# Patient Record
Sex: Female | Born: 1990 | Race: Black or African American | Hispanic: No | Marital: Single | Smoking: Former smoker
Health system: Southern US, Community
[De-identification: ages and names within clinical notes are randomized; demographics above are authoritative.]

## PROBLEM LIST (undated history)

## (undated) ENCOUNTER — Ambulatory Visit: Admission: EM

## (undated) ENCOUNTER — Ambulatory Visit: Source: Home / Self Care

## (undated) DIAGNOSIS — I10 Essential (primary) hypertension: Secondary | ICD-10-CM

## (undated) DIAGNOSIS — S66809A Unspecified injury of other specified muscles, fascia and tendons at wrist and hand level, unspecified hand, initial encounter: Secondary | ICD-10-CM

## (undated) DIAGNOSIS — H9190 Unspecified hearing loss, unspecified ear: Secondary | ICD-10-CM

## (undated) DIAGNOSIS — G932 Benign intracranial hypertension: Secondary | ICD-10-CM

## (undated) DIAGNOSIS — G4700 Insomnia, unspecified: Secondary | ICD-10-CM

## (undated) DIAGNOSIS — G43909 Migraine, unspecified, not intractable, without status migrainosus: Secondary | ICD-10-CM

## (undated) DIAGNOSIS — F419 Anxiety disorder, unspecified: Secondary | ICD-10-CM

## (undated) DIAGNOSIS — R519 Headache, unspecified: Secondary | ICD-10-CM

## (undated) DIAGNOSIS — M1990 Unspecified osteoarthritis, unspecified site: Secondary | ICD-10-CM

## (undated) DIAGNOSIS — Z9889 Other specified postprocedural states: Secondary | ICD-10-CM

## (undated) DIAGNOSIS — R112 Nausea with vomiting, unspecified: Secondary | ICD-10-CM

## (undated) HISTORY — PX: KNEE SURGERY: SHX244

## (undated) HISTORY — PX: BUNIONECTOMY: SHX129

## (undated) HISTORY — PX: HAND SURGERY: SHX662

## (undated) HISTORY — DX: Headache, unspecified: R51.9

## (undated) HISTORY — DX: Migraine, unspecified, not intractable, without status migrainosus: G43.909

## (undated) HISTORY — DX: Insomnia, unspecified: G47.00

## (undated) HISTORY — DX: Essential (primary) hypertension: I10

## (undated) HISTORY — DX: Anxiety disorder, unspecified: F41.9

## (undated) HISTORY — DX: Benign intracranial hypertension: G93.2

## (undated) HISTORY — DX: Unspecified osteoarthritis, unspecified site: M19.90

---
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
## 2008-07-04 ENCOUNTER — Emergency Department (HOSPITAL_COMMUNITY): Admission: EM | Admit: 2008-07-04 | Discharge: 2008-07-04 | Payer: Self-pay | Admitting: Family Medicine

## 2008-08-06 ENCOUNTER — Emergency Department (HOSPITAL_COMMUNITY): Admission: EM | Admit: 2008-08-06 | Discharge: 2008-08-06 | Payer: Self-pay | Admitting: Emergency Medicine

## 2010-01-04 ENCOUNTER — Encounter: Admission: RE | Admit: 2010-01-04 | Discharge: 2010-01-04 | Payer: Self-pay | Admitting: Internal Medicine

## 2010-12-08 ENCOUNTER — Inpatient Hospital Stay (HOSPITAL_COMMUNITY)
Admission: AD | Admit: 2010-12-08 | Discharge: 2010-12-08 | Disposition: A | Discharge status: Home / Self Care | Payer: Medicare Other | Source: Ambulatory Visit | Attending: Obstetrics & Gynecology | Admitting: Obstetrics & Gynecology

## 2010-12-08 DIAGNOSIS — O99891 Other specified diseases and conditions complicating pregnancy: Secondary | ICD-10-CM | POA: Insufficient documentation

## 2010-12-08 DIAGNOSIS — O9989 Other specified diseases and conditions complicating pregnancy, childbirth and the puerperium: Secondary | ICD-10-CM

## 2010-12-08 DIAGNOSIS — J069 Acute upper respiratory infection, unspecified: Secondary | ICD-10-CM

## 2011-03-26 HISTORY — PX: WISDOM TOOTH EXTRACTION: SHX21

## 2011-09-17 ENCOUNTER — Other Ambulatory Visit: Payer: Self-pay | Admitting: Gastroenterology

## 2011-09-17 DIAGNOSIS — R112 Nausea with vomiting, unspecified: Secondary | ICD-10-CM

## 2011-09-17 DIAGNOSIS — R1013 Epigastric pain: Secondary | ICD-10-CM

## 2011-10-02 ENCOUNTER — Ambulatory Visit (HOSPITAL_COMMUNITY)
Admission: RE | Admit: 2011-10-02 | Discharge: 2011-10-02 | Disposition: A | Discharge status: Home / Self Care | Payer: Medicare Other | Source: Ambulatory Visit | Attending: Gastroenterology | Admitting: Gastroenterology

## 2011-10-02 DIAGNOSIS — R1013 Epigastric pain: Secondary | ICD-10-CM

## 2011-10-02 DIAGNOSIS — R112 Nausea with vomiting, unspecified: Secondary | ICD-10-CM

## 2011-10-02 DIAGNOSIS — R109 Unspecified abdominal pain: Secondary | ICD-10-CM | POA: Insufficient documentation

## 2011-10-02 MED ORDER — TECHNETIUM TC 99M MEBROFENIN IV KIT
5.1000 | PACK | Freq: Once | INTRAVENOUS | Status: AC | PRN
  Administered 2011-10-02: 5.1 via INTRAVENOUS

## 2011-10-24 ENCOUNTER — Ambulatory Visit (INDEPENDENT_AMBULATORY_CARE_PROVIDER_SITE_OTHER): Payer: Medicare Other | Admitting: General Surgery

## 2011-10-28 ENCOUNTER — Encounter (INDEPENDENT_AMBULATORY_CARE_PROVIDER_SITE_OTHER): Payer: Self-pay | Admitting: General Surgery

## 2011-10-30 ENCOUNTER — Encounter (INDEPENDENT_AMBULATORY_CARE_PROVIDER_SITE_OTHER): Payer: Self-pay | Admitting: General Surgery

## 2011-10-30 ENCOUNTER — Ambulatory Visit (INDEPENDENT_AMBULATORY_CARE_PROVIDER_SITE_OTHER): Payer: Medicare Other | Admitting: General Surgery

## 2011-10-30 VITALS — BP 118/76 | HR 70 | Temp 97.4°F | Resp 16 | Ht 71.0 in | Wt 196.8 lb

## 2011-10-30 DIAGNOSIS — K828 Other specified diseases of gallbladder: Secondary | ICD-10-CM

## 2011-10-30 NOTE — Progress Notes (Signed)
Patient ID: Patricia Sanchez, female   DOB: April 11, 1991, 21 y.o.   MRN: [id number]  Chief Complaint  Patient presents with  . Other    Eval low ejection fraction  This history and exam were conducted with the aid of a sign language interpreter  HPI Patricia Sanchez is a 21 y.o. female.   HPI 21 year old [place] American female referred by Dr. Jeani Hawking for evaluation of her gallbladder. The patient thinks that she's been having problems for about 3 years however it acutely worsened this past June. She states that she will wake up in the morning with nausea. She will also developed postprandial epigastric discomfort associated with nausea and vomiting. She states that she will develop severe nausea and vomiting about 15-20 minutes after eating. This will be accompanied by discomfort or tightness in her upper abdomen. It does not radiate. She denies any NSAID use. She has tried reflux medication without any relief. She also occasionally has loose stool. She states that spicy foods will make his symptoms worse. She states that she's also 100 pounds over the past year and a half. She denies any fevers or chills. She denies any dysuria. She denies any melena or hematochezia. She denies any jaundice. She denies any acholic stools. She denies any trouble swallowing. She has had an upper and lower endoscopy. She has also had a HIDA scan. Past Medical History  Diagnosis Date  . Hearing impaired person   . Diarrhea   . Nausea & vomiting     Past Surgical History  Procedure Date  . Tonsillectomy 2001    Family History  Problem Relation Age of Onset  . Hypertension Mother   . Other Father     murder    Social History History  Substance Use Topics  . Smoking status: Current Everyday Smoker -- 2 years    Types: Cigarettes  . Smokeless tobacco: Never Used  . Alcohol Use: Yes     socially    No Known Allergies  No current outpatient prescriptions on file.    Review of  Systems Review of Systems  Constitutional: Positive for unexpected weight change. Negative for fever and chills.  HENT: Positive for hearing loss. Negative for congestion, sore throat, trouble swallowing and voice change.   Eyes: Negative for visual disturbance.  Respiratory: Negative for cough and wheezing.   Cardiovascular: Negative for chest pain, palpitations and leg swelling.  Gastrointestinal: Negative for diarrhea, constipation, blood in stool and anal bleeding.  Genitourinary: Negative for dysuria, hematuria, vaginal bleeding and difficulty urinating.  Musculoskeletal: Negative for arthralgias.  Skin: Negative for rash and wound.  Neurological: Negative for seizures, syncope and headaches.  Hematological: Negative for adenopathy. Does not bruise/bleed easily.  Psychiatric/Behavioral: Negative for confusion.    Blood pressure 118/76, pulse 70, temperature 97.4 F (36.3 C), temperature source Temporal, resp. rate 16, height 5\' 11"  (1.803 m), weight 196 lb 12.8 oz (89.268 kg).  Physical Exam Physical Exam  Vitals reviewed. Constitutional: She is oriented to person, place, and time. She appears well-developed and well-nourished. No distress.  HENT:  Head: Normocephalic and atraumatic.  Mouth/Throat: No oropharyngeal exudate.  Eyes: Conjunctivae are normal. No scleral icterus.  Neck: Neck supple. No tracheal deviation present. No thyromegaly present.  Cardiovascular: Normal rate, regular rhythm and normal heart sounds.   Pulmonary/Chest: Effort normal and breath sounds normal. No respiratory distress. She has no wheezes.  Abdominal: Soft. Bowel sounds are normal. She exhibits no distension. There is no tenderness. There is no rebound.  Musculoskeletal: Normal range of motion. She exhibits no edema and no tenderness.  Lymphadenopathy:    She has no cervical adenopathy.  Neurological: She is alert and oriented to person, place, and time. She exhibits normal muscle tone.  Skin:  Skin is warm and dry. She is not diaphoretic.       Scattered tattoos. Belly-button ring  Psychiatric: She has a normal mood and affect. Her behavior is normal. Judgment and thought content normal.    Data Reviewed EGD and colonoscopy report Dr Haywood Pao note HIDA scan  Assessment    Biliary dyskinesia    Plan    We discussed gallbladder disease. The patient was given Agricultural engineer. We discussed non-operative and operative management. Her symptoms and HIDA are suggestive of Biliary dyskinesia.  She has had a negative EGD which I think  also supports biliary dyskinesia as the probable etiology of her symptoms. The only symptom that is somewhat atypical is the excessive weight loss.   I discussed laparoscopic cholecystectomy possible cholangiogram in detail.  The patient was given educational material as well as diagrams detailing the procedure.  We discussed the risks and benefits of a laparoscopic cholecystectomy including, but not limited to bleeding, infection, injury to surrounding structures such as the intestine or liver, bile leak, retained gallstones, need to convert to an open procedure, prolonged diarrhea, blood clots such as  DVT, common bile duct injury, anesthesia risks, and possible need for additional procedures.  We discussed the typical post-operative recovery course. I explained that the likelihood of improvement of their symptoms is good. We also discussed the possibility of her symptoms not resolving which I think are low.   Mary Sella. Andrey Campanile, MD, FACS General, Bariatric, & Minimally Invasive Surgery [facility], [place]         [facility] M 10/30/2011, 10:57 AM

## 2011-10-30 NOTE — Patient Instructions (Signed)
Biliary Colic  Biliary colic is a steady or irregular pain in the upper abdomen. It is usually under the right side of the rib cage. It happens when gallstones interfere with the normal flow of bile from the gallbladder. Bile is a liquid that helps to digest fats. Bile is made in the liver and stored in the gallbladder. When you eat a meal, bile passes from the gallbladder through the cystic duct and the common bile duct into the small intestine. There, it mixes with partially digested food. If a gallstone blocks either of these ducts, the normal flow of bile is blocked. The muscle cells in the bile duct contract forcefully to try to move the stone. This causes the pain of biliary colic.  SYMPTOMS   A person with biliary colic usually complains of pain in the upper abdomen. This pain can be:   In the center of the upper abdomen just below the breastbone.   In the upper-right part of the abdomen, near the gallbladder and liver.   Spread back toward the right shoulder blade.   Nausea and vomiting.   The pain usually occurs after eating.   Biliary colic is usually triggered by the digestive system's demand for bile. The demand for bile is high after fatty meals. Symptoms can also occur when a person who has been fasting suddenly eats a very large meal. Most episodes of biliary colic pass after 1 to 5 hours. After the most intense pain passes, your abdomen may continue to ache mildly for about 24 hours.  DIAGNOSIS  After you describe your symptoms, your caregiver will perform a physical exam. He or she will pay attention to the upper right portion of your belly (abdomen). This is the area of your liver and gallbladder. An ultrasound will help your caregiver look for gallstones. Specialized scans of the gallbladder may also be done. Blood tests may be done, especially if you have fever or if your pain persists. PREVENTION  Biliary colic can be prevented by controlling the risk factors for gallstones.  Some of these risk factors, such as heredity, increasing age, and pregnancy are a normal part of life. Obesity and a high-fat diet are risk factors you can change through a healthy lifestyle. Women going through menopause who take hormone replacement therapy (estrogen) are also more likely to develop biliary colic. TREATMENT   Pain medication may be prescribed.   You may be encouraged to eat a fat-free diet.   If the first episode of biliary colic is severe, or episodes of colic keep retuning, surgery to remove the gallbladder (cholecystectomy) is usually recommended. This procedure can be done through small incisions using an instrument called a laparoscope. The procedure often requires a brief stay in the hospital. Some people can leave the hospital the same day. It is the most widely used treatment in people troubled by painful gallstones. It is effective and safe, with no complications in more than 90% of cases.   If surgery cannot be done, medication that dissolves gallstones may be used. This medication is expensive and can take months or years to work. Only small stones will dissolve.   Rarely, medication to dissolve gallstones is combined with a procedure called shock-wave lithotripsy. This procedure uses carefully aimed shock waves to break up gallstones. In many people treated with this procedure, gallstones form again within a few years.  PROGNOSIS  If gallstones block your cystic duct or common bile duct, you are at risk for repeated episodes of   biliary colic. There is also a 25% chance that you will develop a gallbladder infection(acute cholecystitis), or some other complication of gallstones within 10 to 20 years. If you have surgery, schedule it at a time that is convenient for you and at a time when you are not sick. HOME CARE INSTRUCTIONS   Drink plenty of clear fluids.   Avoid fatty, greasy or fried foods, or any foods that make your pain worse.   Take medications as directed.    SEEK MEDICAL CARE IF:   You develop a fever over 100.5 F (38.1 C).   Your pain gets worse over time.   You develop nausea that prevents you from eating and drinking.   You develop vomiting.  SEEK IMMEDIATE MEDICAL CARE IF:   You have continuous or severe belly (abdominal) pain which is not relieved with medications.   You develop nausea and vomiting which is not relieved with medications.   You have symptoms of biliary colic and you suddenly develop a fever and shaking chills. This may signal cholecystitis. Call your caregiver immediately.   You develop a yellow color to your skin or the white part of your eyes (jaundice).  Document Released: 03/10/2006 Document Revised: 06/19/2011 Document Reviewed: 05/19/2008 Exit[employer] Patient Information 2012 [employer]. 

## 2011-10-31 ENCOUNTER — Encounter (HOSPITAL_COMMUNITY): Payer: Self-pay

## 2011-10-31 ENCOUNTER — Encounter (HOSPITAL_COMMUNITY)
Admission: RE | Admit: 2011-10-31 | Discharge: 2011-10-31 | Disposition: A | Discharge status: Home / Self Care | Payer: Federal, State, Local not specified - PPO | Source: Ambulatory Visit | Attending: General Surgery | Admitting: General Surgery

## 2011-10-31 LAB — DIFFERENTIAL
Basophils Absolute: 0 10*3/uL (ref 0.0–0.1)
Basophils Relative: 1 % (ref 0–1)
Lymphocytes Relative: 43 % (ref 12–46)
Neutro Abs: 2.3 10*3/uL (ref 1.7–7.7)

## 2011-10-31 LAB — COMPREHENSIVE METABOLIC PANEL
ALT: 14 U/L (ref 0–35)
CO2: 26 mEq/L (ref 19–32)
Calcium: 9.4 mg/dL (ref 8.4–10.5)
Chloride: 103 mEq/L (ref 96–112)
Creatinine, Ser: 1.12 mg/dL — ABNORMAL HIGH (ref 0.50–1.10)
GFR calc Af Amer: 80 mL/min — ABNORMAL LOW (ref >90)
GFR calc non Af Amer: 69 mL/min — ABNORMAL LOW (ref >90)
Glucose, Bld: 88 mg/dL (ref 70–99)
Potassium: 4.3 mEq/L (ref 3.5–5.1)
Sodium: 138 mEq/L (ref 135–145)
Total Protein: 7.3 g/dL (ref 6.0–8.3)

## 2011-10-31 LAB — CBC
HCT: 39.2 % (ref 36.0–46.0)
Hemoglobin: 13 g/dL (ref 12.0–15.0)
MCH: 29.7 pg (ref 26.0–34.0)
MCHC: 33.2 g/dL (ref 30.0–36.0)

## 2011-10-31 NOTE — Patient Instructions (Signed)
[address]raft  10/31/2011   Your procedure is scheduled on:  11/01/11  Surgery  [id number]    Friday  Report to [facility] at 0615 AM.  Call this number if you have problems the morning of surgery: [phone]   Remember:   Do not eat food:After Midnight.tonight  May have clear liquids:until Midnight .tonight  Clear liquids include soda, tea, black coffee, apple or grape juice, broth.  Take these medicines the morning of surgery with A SIP OF WATER: none   Do not wear jewelry, make-up or nail polish.  Do not wear lotions, powders, or perfumes. You may wear deodorant.  Do not shave 48 hours prior to surgery.  Do not bring valuables to the hospital.  Contacts, dentures or bridgework may not be worn into surgery.  Leave suitcase in the car. After surgery it may be brought to your room.  For patients admitted to the hospital, checkout time is 11:00 AM the day of discharge.   Patients discharged the day of surgery will not be allowed to drive home.  Name and phone number of your driver: mom Special Instructions: CHG Shower Use Special Wash: 1/2 bottle night before surgery and 1/2 bottle morning of surgery. Regular soap face and privates   Please read over the following fact sheets that you were given: MRSA Information

## 2011-10-31 NOTE — Pre-Procedure Instructions (Signed)
Interpretor Lyda Perone present for interview and labs

## 2011-11-01 ENCOUNTER — Ambulatory Visit (HOSPITAL_COMMUNITY): Payer: Federal, State, Local not specified - PPO

## 2011-11-01 ENCOUNTER — Encounter (HOSPITAL_COMMUNITY): Payer: Self-pay | Admitting: Certified Registered Nurse Anesthetist

## 2011-11-01 ENCOUNTER — Encounter (HOSPITAL_COMMUNITY): Payer: Self-pay | Admitting: *Deleted

## 2011-11-01 ENCOUNTER — Ambulatory Visit (HOSPITAL_COMMUNITY): Payer: Federal, State, Local not specified - PPO | Admitting: Certified Registered Nurse Anesthetist

## 2011-11-01 ENCOUNTER — Encounter (HOSPITAL_COMMUNITY)
Admission: AD | Disposition: A | Discharge status: Home / Self Care | Payer: Self-pay | Source: Ambulatory Visit | Attending: General Surgery

## 2011-11-01 ENCOUNTER — Encounter (INDEPENDENT_AMBULATORY_CARE_PROVIDER_SITE_OTHER): Payer: Self-pay | Admitting: Gastroenterology

## 2011-11-01 ENCOUNTER — Ambulatory Visit (HOSPITAL_COMMUNITY)
Admission: AD | Admit: 2011-11-01 | Discharge: 2011-11-02 | Disposition: A | Discharge status: Home / Self Care | Payer: Federal, State, Local not specified - PPO | Source: Ambulatory Visit | Attending: General Surgery | Admitting: General Surgery

## 2011-11-01 ENCOUNTER — Other Ambulatory Visit (INDEPENDENT_AMBULATORY_CARE_PROVIDER_SITE_OTHER): Payer: Self-pay | Admitting: General Surgery

## 2011-11-01 DIAGNOSIS — K811 Chronic cholecystitis: Secondary | ICD-10-CM | POA: Insufficient documentation

## 2011-11-01 DIAGNOSIS — Z01812 Encounter for preprocedural laboratory examination: Secondary | ICD-10-CM | POA: Insufficient documentation

## 2011-11-01 DIAGNOSIS — R197 Diarrhea, unspecified: Secondary | ICD-10-CM | POA: Insufficient documentation

## 2011-11-01 DIAGNOSIS — R110 Nausea: Secondary | ICD-10-CM | POA: Insufficient documentation

## 2011-11-01 DIAGNOSIS — R1013 Epigastric pain: Secondary | ICD-10-CM | POA: Insufficient documentation

## 2011-11-01 DIAGNOSIS — R112 Nausea with vomiting, unspecified: Secondary | ICD-10-CM | POA: Insufficient documentation

## 2011-11-01 DIAGNOSIS — K828 Other specified diseases of gallbladder: Secondary | ICD-10-CM | POA: Insufficient documentation

## 2011-11-01 DIAGNOSIS — H9190 Unspecified hearing loss, unspecified ear: Secondary | ICD-10-CM | POA: Insufficient documentation

## 2011-11-01 DIAGNOSIS — R1011 Right upper quadrant pain: Secondary | ICD-10-CM | POA: Insufficient documentation

## 2011-11-01 HISTORY — PX: CHOLECYSTECTOMY: SHX55

## 2011-11-01 LAB — ABO/RH: ABO/RH(D): B POS

## 2011-11-01 LAB — CBC
MCH: 29.5 pg (ref 26.0–34.0)
MCHC: 33.1 g/dL (ref 30.0–36.0)
MCV: 89.2 fL (ref 78.0–100.0)
Platelets: 193 10*3/uL (ref 150–400)
RBC: 4.27 MIL/uL (ref 3.87–5.11)
RDW: 13.9 % (ref 11.5–15.5)

## 2011-11-01 SURGERY — LAPAROSCOPIC CHOLECYSTECTOMY
Anesthesia: General | Site: Abdomen | Wound class: Clean Contaminated

## 2011-11-01 MED ORDER — LACTATED RINGERS IR SOLN
Status: DC | PRN
  Administered 2011-11-01: 1000 mL

## 2011-11-01 MED ORDER — MORPHINE SULFATE 2 MG/ML IJ SOLN
2.0000 mg | INTRAMUSCULAR | Status: DC | PRN
  Administered 2011-11-01 (×2): 4 mg via INTRAVENOUS
  Administered 2011-11-01 – 2011-11-02 (×3): 2 mg via INTRAVENOUS
  Filled 2011-11-01 (×7): qty 1

## 2011-11-01 MED ORDER — PROMETHAZINE HCL 25 MG/ML IJ SOLN
6.2500 mg | INTRAMUSCULAR | Status: DC | PRN
Start: 2011-11-01 — End: 2011-11-01
  Administered 2011-11-01: 6.25 mg via INTRAVENOUS

## 2011-11-01 MED ORDER — FENTANYL CITRATE 0.05 MG/ML IJ SOLN
INTRAMUSCULAR | Status: DC | PRN
  Administered 2011-11-01 (×6): 50 ug via INTRAVENOUS

## 2011-11-01 MED ORDER — CISATRACURIUM BESYLATE 2 MG/ML IV SOLN
INTRAVENOUS | Status: DC | PRN
  Administered 2011-11-01: 6 mg via INTRAVENOUS

## 2011-11-01 MED ORDER — ONDANSETRON HCL 4 MG/2ML IJ SOLN
INTRAMUSCULAR | Status: DC | PRN
  Administered 2011-11-01: 4 mg via INTRAVENOUS

## 2011-11-01 MED ORDER — PANTOPRAZOLE SODIUM 40 MG IV SOLR
40.0000 mg | INTRAVENOUS | Status: DC
  Administered 2011-11-01: 40 mg via INTRAVENOUS
  Filled 2011-11-01 (×2): qty 40

## 2011-11-01 MED ORDER — DOCUSATE SODIUM 100 MG PO CAPS
100.0000 mg | ORAL_CAPSULE | Freq: Two times a day (BID) | ORAL | Status: DC
  Administered 2011-11-01: 100 mg via ORAL
  Filled 2011-11-01 (×3): qty 1

## 2011-11-01 MED ORDER — LACTATED RINGERS IV SOLN
INTRAVENOUS | Status: DC | PRN
  Administered 2011-11-01 (×2): via INTRAVENOUS

## 2011-11-01 MED ORDER — LIDOCAINE HCL (CARDIAC) 20 MG/ML IV SOLN
INTRAVENOUS | Status: DC | PRN
  Administered 2011-11-01: 80 mg via INTRAVENOUS

## 2011-11-01 MED ORDER — CEFOXITIN SODIUM-DEXTROSE 1-4 GM-% IV SOLR (PREMIX)
INTRAVENOUS | Status: AC
  Filled 2011-11-01: qty 100

## 2011-11-01 MED ORDER — FENTANYL CITRATE 0.05 MG/ML IJ SOLN
INTRAMUSCULAR | Status: AC
  Filled 2011-11-01: qty 2

## 2011-11-01 MED ORDER — BUPIVACAINE-EPINEPHRINE 0.25% -1:200000 IJ SOLN
INTRAMUSCULAR | Status: DC | PRN
  Administered 2011-11-01: 30 mL

## 2011-11-01 MED ORDER — LACTATED RINGERS IV SOLN: INTRAVENOUS | Status: DC

## 2011-11-01 MED ORDER — ACETAMINOPHEN 10 MG/ML IV SOLN
INTRAVENOUS | Status: DC | PRN
  Administered 2011-11-01: 1000 mg via INTRAVENOUS

## 2011-11-01 MED ORDER — DEXTROSE 5 % IV SOLN
2.0000 g | INTRAVENOUS | Status: AC
  Administered 2011-11-01: 2 g via INTRAVENOUS

## 2011-11-01 MED ORDER — SUCCINYLCHOLINE CHLORIDE 20 MG/ML IJ SOLN
INTRAMUSCULAR | Status: DC | PRN
  Administered 2011-11-01: 100 mg via INTRAVENOUS

## 2011-11-01 MED ORDER — IOHEXOL 300 MG/ML  SOLN
INTRAMUSCULAR | Status: DC | PRN
  Administered 2011-11-01: 25 mL

## 2011-11-01 MED ORDER — ACETAMINOPHEN 10 MG/ML IV SOLN
INTRAVENOUS | Status: AC
  Filled 2011-11-01: qty 100

## 2011-11-01 MED ORDER — ONDANSETRON HCL 4 MG/2ML IJ SOLN: 4.0000 mg | Freq: Four times a day (QID) | INTRAMUSCULAR | Status: DC | PRN

## 2011-11-01 MED ORDER — KCL IN DEXTROSE-NACL 20-5-0.45 MEQ/L-%-% IV SOLN
INTRAVENOUS | Status: DC
  Administered 2011-11-01: 75 mL/h via INTRAVENOUS
  Administered 2011-11-02: 05:00:00 via INTRAVENOUS
  Filled 2011-11-01 (×4): qty 1000

## 2011-11-01 MED ORDER — FENTANYL CITRATE 0.05 MG/ML IJ SOLN
25.0000 ug | INTRAMUSCULAR | Status: DC | PRN
  Administered 2011-11-01 (×3): 50 ug via INTRAVENOUS

## 2011-11-01 MED ORDER — MIDAZOLAM HCL 5 MG/5ML IJ SOLN
INTRAMUSCULAR | Status: DC | PRN
  Administered 2011-11-01: 1 mg via INTRAVENOUS

## 2011-11-01 MED ORDER — OXYCODONE HCL 5 MG PO TABS
5.0000 mg | ORAL_TABLET | ORAL | Status: DC | PRN
  Administered 2011-11-01 – 2011-11-02 (×4): 10 mg via ORAL
  Filled 2011-11-01 (×4): qty 2

## 2011-11-01 MED ORDER — PROPOFOL 10 MG/ML IV BOLUS
INTRAVENOUS | Status: DC | PRN
  Administered 2011-11-01: 200 mg via INTRAVENOUS

## 2011-11-01 MED ORDER — PROMETHAZINE HCL 25 MG/ML IJ SOLN
INTRAMUSCULAR | Status: AC
  Filled 2011-11-01: qty 1

## 2011-11-01 MED ORDER — ACETAMINOPHEN 10 MG/ML IV SOLN
1000.0000 mg | Freq: Four times a day (QID) | INTRAVENOUS | Status: DC
  Administered 2011-11-01 – 2011-11-02 (×3): 1000 mg via INTRAVENOUS
  Filled 2011-11-01 (×4): qty 100

## 2011-11-01 MED ORDER — ONDANSETRON HCL 4 MG PO TABS: 4.0000 mg | ORAL_TABLET | Freq: Four times a day (QID) | ORAL | Status: DC | PRN

## 2011-11-01 SURGICAL SUPPLY — 44 items
ADH SKN CLS APL DERMABOND .7 (GAUZE/BANDAGES/DRESSINGS) ×1
APL SKNCLS STERI-STRIP NONHPOA (GAUZE/BANDAGES/DRESSINGS) ×1
BAG SPEC RTRVL LRG 6X4 10 (ENDOMECHANICALS) ×1
BANDAGE ADHESIVE 1X3 (GAUZE/BANDAGES/DRESSINGS) ×6 IMPLANT
BENZOIN TINCTURE PRP APPL 2/3 (GAUZE/BANDAGES/DRESSINGS) ×2 IMPLANT
BLADE SURG SZ11 CARB STEEL (BLADE) ×1 IMPLANT
CANISTER SUCTION 2500CC (MISCELLANEOUS) ×2 IMPLANT
CHLORAPREP W/TINT 26ML (MISCELLANEOUS) ×2 IMPLANT
CLOTH BEACON ORANGE TIMEOUT ST (SAFETY) ×2 IMPLANT
CORD HIGH FREQUENCY UNIPOLAR (ELECTROSURGICAL) ×2 IMPLANT
COVER MAYO STAND STRL (DRAPES) ×1 IMPLANT
COVER SURGICAL LIGHT HANDLE (MISCELLANEOUS) ×2 IMPLANT
DERMABOND ADVANCED (GAUZE/BANDAGES/DRESSINGS) ×1
DERMABOND ADVANCED .7 DNX12 (GAUZE/BANDAGES/DRESSINGS) IMPLANT
DRAPE C-ARM 42X72 X-RAY (DRAPES) ×1 IMPLANT
DRAPE LAPAROSCOPIC ABDOMINAL (DRAPES) ×2 IMPLANT
DRAPE UTILITY XL STRL (DRAPES) ×2 IMPLANT
DRSG TEGADERM 2-3/8X2-3/4 SM (GAUZE/BANDAGES/DRESSINGS) ×2 IMPLANT
ELECT REM PT RETURN 9FT ADLT (ELECTROSURGICAL) ×2
ELECTRODE REM PT RTRN 9FT ADLT (ELECTROSURGICAL) ×1 IMPLANT
GLOVE BIO SURGEON STRL SZ7.5 (GLOVE) ×2 IMPLANT
GLOVE BIOGEL PI IND STRL 7.0 (GLOVE) ×1 IMPLANT
GLOVE BIOGEL PI IND STRL 8 (GLOVE) IMPLANT
GLOVE BIOGEL PI INDICATOR 7.0 (GLOVE) ×1
GLOVE BIOGEL PI INDICATOR 8 (GLOVE) ×1
GLOVE ECLIPSE 8.0 STRL XLNG CF (GLOVE) ×1 IMPLANT
GLOVE INDICATOR 8.0 STRL GRN (GLOVE) ×2 IMPLANT
GOWN STRL NON-REIN LRG LVL3 (GOWN DISPOSABLE) ×2 IMPLANT
GOWN STRL REIN XL XLG (GOWN DISPOSABLE) ×4 IMPLANT
KIT BASIN OR (CUSTOM PROCEDURE TRAY) ×2 IMPLANT
NS IRRIG 1000ML POUR BTL (IV SOLUTION) ×2 IMPLANT
POUCH SPECIMEN RETRIEVAL 10MM (ENDOMECHANICALS) ×2 IMPLANT
SET CHOLANGIOGRAPH MIX (MISCELLANEOUS) ×1 IMPLANT
SET IRRIG TUBING LAPAROSCOPIC (IRRIGATION / IRRIGATOR) ×2 IMPLANT
SOLUTION ANTI FOG 6CC (MISCELLANEOUS) ×2 IMPLANT
STRIP CLOSURE SKIN 1/2X4 (GAUZE/BANDAGES/DRESSINGS) ×2 IMPLANT
SUT MNCRL AB 4-0 PS2 18 (SUTURE) ×2 IMPLANT
SUT VICRYL 0 UR6 27IN ABS (SUTURE) ×2 IMPLANT
TOWEL OR 17X26 10 PK STRL BLUE (TOWEL DISPOSABLE) ×3 IMPLANT
TRAY LAP CHOLE (CUSTOM PROCEDURE TRAY) ×2 IMPLANT
TROCAR BLADELESS OPT 5 75 (ENDOMECHANICALS) ×4 IMPLANT
TROCAR XCEL BLUNT TIP 100MML (ENDOMECHANICALS) ×2 IMPLANT
TROCAR XCEL NON-BLD 11X100MML (ENDOMECHANICALS) ×2 IMPLANT
TUBING INSUFFLATION 10FT LAP (TUBING) ×2 IMPLANT

## 2011-11-01 NOTE — Progress Notes (Signed)
Arrived when mother and friend who does deaf interpretation were in the room with the patient. Mother requested prayer. Prayed while interpretor interpreted. Presence.   11/01/11 1400  Clinical Encounter Type  Visited With Patient and family together  Visit Type Spiritual support  Referral From Patient's clergy  Spiritual Encounters  Spiritual Needs Prayer

## 2011-11-01 NOTE — Transfer of Care (Signed)
Immediate Anesthesia Transfer of Care Note  Patient: Patricia Sanchez  Procedure(s) Performed:  LAPAROSCOPIC CHOLECYSTECTOMY - with intraoperative cholangiogram  Patient Location: PACU  Anesthesia Type: General  Level of Consciousness: awake and alert   Airway & Oxygen Therapy: Patient Spontanous Breathing  Post-op Assessment: Report given to PACU RN  Post vital signs: Reviewed and stable Filed Vitals:   11/01/11 0630  BP: 125/73  Pulse: 64  Temp: 36.1 C  Resp: 18    Complications: No apparent anesthesia complications

## 2011-11-01 NOTE — Anesthesia Preprocedure Evaluation (Addendum)
Anesthesia Evaluation  Patient identified by MRN, date of birth, ID band Patient awake    Reviewed: Allergy & Precautions, H&P , NPO status , Patient's Chart, lab work & pertinent test results  History of Anesthesia Complications Negative for: history of anesthetic complications  Airway Mallampati: I TM Distance: >3 FB Neck ROM: full    Dental No notable dental hx. (+) Teeth Intact and Dental Advidsory Given   Pulmonary neg pulmonary ROS,  clear to auscultation  Pulmonary exam normal       Cardiovascular Exercise Tolerance: Good neg cardio ROS regular Normal    Neuro/Psych PSYCHIATRIC DISORDERS (Anxiety) Patient is deaf Negative Neurological ROS     GI/Hepatic negative GI ROS, Neg liver ROS,   Endo/Other  Negative Endocrine ROS  Renal/GU negative Renal ROS  Genitourinary negative   Musculoskeletal   Abdominal Normal abdominal exam  (+)   Peds  Hematology negative hematology ROS (+)   Anesthesia Other Findings Patient clinically deaf; H & P with aid of interpretor/sign language  Reproductive/Obstetrics negative OB ROS                          Anesthesia Physical Anesthesia Plan  ASA: II  Anesthesia Plan: General   Post-op Pain Management:    Induction:   Airway Management Planned:   Additional Equipment:   Intra-op Plan:   Post-operative Plan:   Informed Consent: I have reviewed the patients History and Physical, chart, labs and discussed the procedure including the risks, benefits and alternatives for the proposed anesthesia with the patient or authorized representative who has indicated his/her understanding and acceptance.   Dental Advisory Given  Plan Discussed with: CRNA  Anesthesia Plan Comments:         Anesthesia Quick Evaluation

## 2011-11-01 NOTE — Anesthesia Postprocedure Evaluation (Signed)
Anesthesia Post Note  Patient: Patricia Sanchez  Procedure(s) Performed:  LAPAROSCOPIC CHOLECYSTECTOMY - with intraoperative cholangiogram  Anesthesia type: General  Patient location: PACU  Post pain: Pain level controlled  Post assessment: Post-op Vital signs reviewed  Last Vitals:  Filed Vitals:   11/01/11 1230  BP: 132/77  Pulse:   Temp:   Resp:     Post vital signs: Reviewed  Level of consciousness: sedated  Complications: No apparent anesthesia complications

## 2011-11-01 NOTE — Anesthesia Procedure Notes (Addendum)
Procedure Name: Intubation Date/Time: 11/01/2011 8:57 AM Performed by: Hulan Fess Pre-anesthesia Checklist: Patient identified, Emergency Drugs available, Suction available, Patient being monitored and Timeout performed Patient Re-evaluated:Patient Re-evaluated prior to inductionOxygen Delivery Method: Circle System Utilized Preoxygenation: Pre-oxygenation with 100% oxygen Intubation Type: IV induction Ventilation: Mask ventilation without difficulty Laryngoscope Size: Mac and 3 Tube size: 8.0 mm Number of attempts: 1 Placement Confirmation: ETT inserted through vocal cords under direct vision,  breath sounds checked- equal and bilateral and positive ETCO2 Secured at: 22 cm Tube secured with: Tape Dental Injury: Teeth and Oropharynx as per pre-operative assessment    Date/Time: 11/01/2011 8:57 AM Performed by: Drucella Karbowski K. Grade View: Grade I

## 2011-11-01 NOTE — Interval H&P Note (Signed)
History and Physical Interval Note:  11/01/2011 8:40 AM  Patricia Sanchez  has presented today for surgery, with the diagnosis of biliary dyskinensia   The various methods of treatment have been discussed with the patient and family. After consideration of risks, benefits and other options for treatment, the patient has consented to  Procedure(s): LAPAROSCOPIC CHOLECYSTECTOMY as a surgical intervention .  The patients' history has been reviewed, patient examined, no change in status, stable for surgery.  I have reviewed the patients' chart and labs.  Questions were answered to the patient's satisfaction.     Mary Sella. Andrey Campanile, MD, FACS General, Bariatric, & Minimally Invasive Surgery [facility], [place]   [facility] M

## 2011-11-01 NOTE — H&P (View-Only) (Signed)
Patient ID: Patricia Sanchez, female   DOB: 05/04/1991, 20 y.o.   MRN: [id number]  Chief Complaint  Patient presents with  . Other    Eval low ejection fraction  This history and exam were conducted with the aid of a sign language interpreter  HPI Patricia Sanchez is a 20 y.o. female.   HPI 20-year-old African American female referred by Dr. Patrick Hung for evaluation of her gallbladder. The patient thinks that she's been having problems for about 3 years however it acutely worsened this past June. She states that she will wake up in the morning with nausea. She will also developed postprandial epigastric discomfort associated with nausea and vomiting. She states that she will develop severe nausea and vomiting about 15-20 minutes after eating. This will be accompanied by discomfort or tightness in her upper abdomen. It does not radiate. She denies any NSAID use. She has tried reflux medication without any relief. She also occasionally has loose stool. She states that spicy foods will make his symptoms worse. She states that she's also 100 pounds over the past year and a half. She denies any fevers or chills. She denies any dysuria. She denies any melena or hematochezia. She denies any jaundice. She denies any acholic stools. She denies any trouble swallowing. She has had an upper and lower endoscopy. She has also had a HIDA scan. Past Medical History  Diagnosis Date  . Hearing impaired person   . Diarrhea   . Nausea & vomiting     Past Surgical History  Procedure Date  . Tonsillectomy 2001    Family History  Problem Relation Age of Onset  . Hypertension Mother   . Other Father     murder    Social History History  Substance Use Topics  . Smoking status: Current Everyday Smoker -- 2 years    Types: Cigarettes  . Smokeless tobacco: Never Used  . Alcohol Use: Yes     socially    No Known Allergies  No current outpatient prescriptions on file.    Review of  Systems Review of Systems  Constitutional: Positive for unexpected weight change. Negative for fever and chills.  HENT: Positive for hearing loss. Negative for congestion, sore throat, trouble swallowing and voice change.   Eyes: Negative for visual disturbance.  Respiratory: Negative for cough and wheezing.   Cardiovascular: Negative for chest pain, palpitations and leg swelling.  Gastrointestinal: Negative for diarrhea, constipation, blood in stool and anal bleeding.  Genitourinary: Negative for dysuria, hematuria, vaginal bleeding and difficulty urinating.  Musculoskeletal: Negative for arthralgias.  Skin: Negative for rash and wound.  Neurological: Negative for seizures, syncope and headaches.  Hematological: Negative for adenopathy. Does not bruise/bleed easily.  Psychiatric/Behavioral: Negative for confusion.    Blood pressure 118/76, pulse 70, temperature 97.4 F (36.3 C), temperature source Temporal, resp. rate 16, height 5' 11" (1.803 m), weight 196 lb 12.8 oz (89.268 kg).  Physical Exam Physical Exam  Vitals reviewed. Constitutional: She is oriented to person, place, and time. She appears well-developed and well-nourished. No distress.  HENT:  Head: Normocephalic and atraumatic.  Mouth/Throat: No oropharyngeal exudate.  Eyes: Conjunctivae are normal. No scleral icterus.  Neck: Neck supple. No tracheal deviation present. No thyromegaly present.  Cardiovascular: Normal rate, regular rhythm and normal heart sounds.   Pulmonary/Chest: Effort normal and breath sounds normal. No respiratory distress. She has no wheezes.  Abdominal: Soft. Bowel sounds are normal. She exhibits no distension. There is no tenderness. There is no rebound.    Musculoskeletal: Normal range of motion. She exhibits no edema and no tenderness.  Lymphadenopathy:    She has no cervical adenopathy.  Neurological: She is alert and oriented to person, place, and time. She exhibits normal muscle tone.  Skin:  Skin is warm and dry. She is not diaphoretic.       Scattered tattoos. Belly-button ring  Psychiatric: She has a normal mood and affect. Her behavior is normal. Judgment and thought content normal.    Data Reviewed EGD and colonoscopy report Dr Hung's note HIDA scan  Assessment    Biliary dyskinesia    Plan    We discussed gallbladder disease. The patient was given educational material. We discussed non-operative and operative management. Her symptoms and HIDA are suggestive of Biliary dyskinesia.  She has had a negative EGD which I think  also supports biliary dyskinesia as the probable etiology of her symptoms. The only symptom that is somewhat atypical is the excessive weight loss.   I discussed laparoscopic cholecystectomy possible cholangiogram in detail.  The patient was given educational material as well as diagrams detailing the procedure.  We discussed the risks and benefits of a laparoscopic cholecystectomy including, but not limited to bleeding, infection, injury to surrounding structures such as the intestine or liver, bile leak, retained gallstones, need to convert to an open procedure, prolonged diarrhea, blood clots such as  DVT, common bile duct injury, anesthesia risks, and possible need for additional procedures.  We discussed the typical post-operative recovery course. I explained that the likelihood of improvement of their symptoms is good. We also discussed the possibility of her symptoms not resolving which I think are low.   Lutisha Knoche M. Yulitza Shorts, MD, FACS General, Bariatric, & Minimally Invasive Surgery [facility]         Zi Sek M 10/30/2011, 10:57 AM    

## 2011-11-01 NOTE — Op Note (Signed)
Laparoscopic Cholecystectomy with IOC Procedure Note  Indications: This patient presents with symptomatic gallbladder disease and will undergo laparoscopic cholecystectomy. The patient has postprandial epigastric and right upper quadrant pain and nausea.  The patient's HIDA scan showed a low gallbladder ejection fraction  Pre-operative Diagnosis: Biliary dyskinesia  Post-operative Diagnosis: Same  Surgeon: Atilano Ina   Assistants: Avel Peace, MD  Anesthesia: General endotracheal anesthesia and Local anesthesia 0.25.% bupivacaine, with epinephrine  ASA Class: 2  Procedure Details  The patient was seen again in the Holding Room. The risks, benefits, complications, treatment options, and expected outcomes were discussed with the patient. The possibilities of reaction to medication, pulmonary aspiration, perforation of viscus, bleeding, recurrent infection, finding a normal gallbladder, the need for additional procedures, failure to diagnose a condition, the possible need to convert to an open procedure, and creating a complication requiring transfusion or operation were discussed with the patient. The likelihood of improving the patient's symptoms with return to their baseline status is good.  The patient and/or family concurred with the proposed plan, giving informed consent. The site of surgery properly noted. The patient was taken to Operating Room, identified as [facility] and the procedure verified as Laparoscopic Cholecystectomy with Intraoperative Cholangiogram. A Time Out was held and the above information confirmed.  Prior to the induction of general anesthesia, antibiotic prophylaxis was administered. General endotracheal anesthesia was then administered and tolerated well. After the induction, the abdomen was prepped with Chloraprep and draped in the sterile fashion. The patient was positioned in the supine position.  Local anesthetic agent was injected into the skin  near the umbilicus and an incision made. We dissected down to the abdominal fascia with blunt dissection.  The anterior fascia was incised vertically. Despite dissecting for several minutes, I was unable to grasp and incise the peritoneum. Therefore, I switched to an Opti-view technique to gain entry to the abdominal cavity. A 1cm incision was made in the right upper quadrant. Then using a 0 degree 5 mm scope thru a 5mm trocar, I advanced the trocar with the camera thru the abdominal wall under direct visualization. The abdominal cavity was entered and pneumoperitoneum was easily established to a patient pressure of 15 mmHg.  The laparoscope was advanced and the abdominal cavity was inspected. There appeared to be a defect in the ascending colon mesentery with a hematoma forming in the mesentery. There did not appear to be any bowel injury.   An 11-mm port was placed in the subxiphoid position.  An additional 5-mm port was placed in the lateral right upper quadrant. An 11mm trocar was placed the infraumbilical incision under direct visualization. All skin incisions were infiltrated with a local anesthetic agent before making the incision and placing the trocars. I placed a piece of surgical snow in the colonic mesentery defect. The hematoma did not appear to be expanding. I proceeded with the cholecystectomy.   We positioned the patient in reverse Trendelenburg, tilted slightly to the patient's left.  The gallbladder was identified, the fundus grasped and retracted cephalad. Adhesions were lysed bluntly and with the electrocautery where indicated, taking care not to injure any adjacent organs or viscus. The infundibulum was grasped and retracted laterally, exposing the peritoneum overlying the triangle of Calot. This was then divided and exposed in a blunt fashion. A critical view of the cystic duct and cystic artery was obtained.  The cystic duct was clearly identified and bluntly dissected circumferentially. The  cystic duct was ligated with a clip distally.  An incision was made in the cystic duct and the [facility] cholangiogram catheter introduced. The catheter was secured using a clip. A cholangiogram was then obtained which showed good visualization of the distal and proximal biliary tree with no sign of filling defects or obstruction.  Contrast flowed easily into the duodenum. The catheter was then removed.   The cystic duct was then ligated with four clips and divided. The cystic artery was identified, dissected free, ligated with two clips and divided as well.   At this point, Dr Abbey Chatters joined me in the operating room. The gallbladder was dissected from the liver bed in retrograde fashion with the electrocautery. The gallbladder was removed and placed in an Endocatch sac. The liver bed was irrigated and inspected. Hemostasis was achieved with the electrocautery. Copious irrigation was utilized and was repeatedly aspirated until clear.    At this point, the mesenteric defect was inspected. The surgical snow was removed. There was no evidence of ongoing bleeding. The mesenteric defect was well away from the duodenum. The colonic mesenteric defect was further opened up. The medial wall of the right colon was inspected and there was no evidence of serosal injury.  The mesenteric defect was thoroughly inspected and there was no evidence of great vessel or enteric injury. Hemostasis was achieved.   The gallbladder and Endocatch sac were then removed through the umbilical port site.  Several interrupted 0-vicryl sutures were used to close the umbilical fascia.    We again inspected the right upper quadrant for hemostasis.  The umbilical fascial closure was inspected and there was no air leak. Pneumoperitoneum was released as we removed the trocars.  4-0 Monocryl was used to close the skin.   Benzoin, steri-strips, and clean dressings were applied. The patient was then extubated and brought to the recovery room in  stable condition. Instrument, sponge, and needle counts were correct at closure and at the conclusion of the case.   Findings: See above  Estimated Blood Loss: <100cc         Drains: none         Specimens: Gallbladder           Complications: patient tolerated the procedure well.         Disposition: PACU - hemodynamically stable.         Condition: stable  Patricia Sanchez. Andrey Campanile, MD, FACS General, Bariatric, & Minimally Invasive Surgery [facility] Surgery, [place]

## 2011-11-02 LAB — CBC
HCT: 34.3 % — ABNORMAL LOW (ref 36.0–46.0)
MCHC: 32.7 g/dL (ref 30.0–36.0)
Platelets: 192 10*3/uL (ref 150–400)
RDW: 14 % (ref 11.5–15.5)
WBC: 6.5 10*3/uL (ref 4.0–10.5)

## 2011-11-02 MED ORDER — OXYCODONE HCL 5 MG PO TABS: 5.0000 mg | ORAL_TABLET | ORAL | Status: DC | PRN

## 2011-11-02 NOTE — Progress Notes (Signed)
1 Day Post-Op  Subjective: Feels well.  Minimal discomfort.  Tolerating diet. No nausea  Objective: Vital signs in last 24 hours: Temp:  [98.2 F (36.8 C)-98.7 F (37.1 C)] 98.4 F (36.9 C) (01/12 0505) Pulse Rate:  [56-75] 75  (01/12 0505) Resp:  [11-20] 18  (01/12 0505) BP: (112-177)/(60-89) 128/65 mmHg (01/12 0505) SpO2:  [99 %-100 %] 100 % (01/12 0505) Weight:  [178 lb 14.4 oz (81.149 kg)] 178 lb 14.4 oz (81.149 kg) (01/11 1500) Last BM Date: 10/31/11  Intake/Output from previous day: 01/11 0701 - 01/12 0700 In: 2431 [I.V.:2431] Out: -  Intake/Output this shift:    General appearance: alert, cooperative and no distress Resp: nonlabored GI: soft, minimal periumbilical tenderness, ND, wounds without infection, no peritonitis, very minimal tenderness at all.  Lab Results:   Basename 11/02/11 0520 11/01/11 1125  WBC 6.5 7.0  HGB 11.2* 12.6  HCT 34.3* 38.1  PLT 192 193   BMET  Basename 10/31/11 1550  NA 138  K 4.3  CL 103  CO2 26  GLUCOSE 88  BUN 10  CREATININE 1.12*  CALCIUM 9.4   PT/INR No results found for this basename: LABPROT:2,INR:2 in the last 72 hours ABG No results found for this basename: PHART:2,PCO2:2,PO2:2,HCO3:2 in the last 72 hours  Studies/Results: Dg Cholangiogram Operative  11/01/2011  *RADIOLOGY REPORT*  Clinical Data:   Laparoscopic cholecystectomy.  INTRAOPERATIVE CHOLANGIOGRAM  Comparison: None.  Findings: Normal caliber biliary system.  No evidence of obstruction.  Small rounded mobile filling defects are seen in the intrahepatic branches, felt to represent air bubbles.  IMPRESSION: Filling defects within the intrahepatic branches are felt to represent air bubbles.  No definite retained stone.  No obstruction.  These images were submitted for radiologic interpretation only. Please see the procedural report for the amount of contrast and the fluoroscopy time utilized.  Original Report Authenticated By: Cyndie Chime, M.D.     Anti-infectives: Anti-infectives     Start     Dose/Rate Route Frequency Ordered Stop   11/01/11 0645   cefOXitin (MEFOXIN) 2 g in dextrose 5 % 50 mL IVPB        2 g 100 mL/hr over 30 Minutes Intravenous 60 min pre-op 11/01/11 0640 11/01/11 0845          Assessment/Plan: s/p Procedure(s): LAPAROSCOPIC CHOLECYSTECTOMY She looks and feels well.  Interviewed through interpreter.  discharge instructions reviewed as well.  She looks very well and there is no evidence of any visceral injury or bleeding.  Will plan for discharge today.  LOS: 1 day    Lodema Pilot DAVID 11/02/2011

## 2011-11-02 NOTE — Progress Notes (Signed)
Patient discharge home with mother and friend, alert and oriented, interpretor at bedside for this patient is deaf and mother and friend also, discharge instruction given by sign language by interpretor, patient sign understanding of instructions given, patient in stable condition at this time

## 2011-11-04 ENCOUNTER — Encounter (HOSPITAL_COMMUNITY): Payer: Self-pay | Admitting: General Surgery

## 2011-11-06 ENCOUNTER — Inpatient Hospital Stay (HOSPITAL_COMMUNITY): Payer: Medicare Other

## 2011-11-06 ENCOUNTER — Encounter (INDEPENDENT_AMBULATORY_CARE_PROVIDER_SITE_OTHER): Payer: Self-pay | Admitting: General Surgery

## 2011-11-06 ENCOUNTER — Inpatient Hospital Stay (HOSPITAL_COMMUNITY)
Admission: AD | Admit: 2011-11-06 | Discharge: 2011-11-09 | DRG: 392 | Disposition: A | Discharge status: Home / Self Care | Payer: Medicare Other | Source: Ambulatory Visit | Attending: General Surgery | Admitting: General Surgery

## 2011-11-06 ENCOUNTER — Encounter (HOSPITAL_COMMUNITY): Payer: Self-pay | Admitting: General Practice

## 2011-11-06 ENCOUNTER — Telehealth (INDEPENDENT_AMBULATORY_CARE_PROVIDER_SITE_OTHER): Payer: Self-pay | Admitting: General Surgery

## 2011-11-06 ENCOUNTER — Ambulatory Visit (INDEPENDENT_AMBULATORY_CARE_PROVIDER_SITE_OTHER): Payer: Medicare Other | Admitting: General Surgery

## 2011-11-06 VITALS — BP 136/70 | HR 68 | Temp 97.6°F | Resp 16 | Ht 71.0 in | Wt 190.4 lb

## 2011-11-06 DIAGNOSIS — R1031 Right lower quadrant pain: Secondary | ICD-10-CM | POA: Diagnosis present

## 2011-11-06 DIAGNOSIS — Z9889 Other specified postprocedural states: Secondary | ICD-10-CM | POA: Diagnosis present

## 2011-11-06 DIAGNOSIS — Z23 Encounter for immunization: Secondary | ICD-10-CM

## 2011-11-06 DIAGNOSIS — K910 Vomiting following gastrointestinal surgery: Secondary | ICD-10-CM

## 2011-11-06 DIAGNOSIS — R112 Nausea with vomiting, unspecified: Principal | ICD-10-CM | POA: Diagnosis present

## 2011-11-06 DIAGNOSIS — H9190 Unspecified hearing loss, unspecified ear: Secondary | ICD-10-CM | POA: Diagnosis present

## 2011-11-06 HISTORY — DX: Other specified postprocedural states: Z98.890

## 2011-11-06 HISTORY — DX: Nausea with vomiting, unspecified: R11.2

## 2011-11-06 LAB — DIFFERENTIAL
Basophils Absolute: 0 10*3/uL (ref 0.0–0.1)
Eosinophils Absolute: 0.3 10*3/uL (ref 0.0–0.7)
Eosinophils Relative: 4 % (ref 0–5)
Lymphocytes Relative: 23 % (ref 12–46)
Monocytes Absolute: 0.6 10*3/uL (ref 0.1–1.0)

## 2011-11-06 LAB — CBC
HCT: 35.4 % — ABNORMAL LOW (ref 36.0–46.0)
MCH: 29.9 pg (ref 26.0–34.0)
MCV: 88.1 fL (ref 78.0–100.0)
Platelets: 186 10*3/uL (ref 150–400)
RBC: 4.02 MIL/uL (ref 3.87–5.11)
RDW: 13.4 % (ref 11.5–15.5)

## 2011-11-06 LAB — LIPASE, BLOOD: Lipase: 21 U/L (ref 11–59)

## 2011-11-06 LAB — COMPREHENSIVE METABOLIC PANEL
Albumin: 3.8 g/dL (ref 3.5–5.2)
Alkaline Phosphatase: 45 U/L (ref 39–117)
BUN: 9 mg/dL (ref 6–23)
Calcium: 9.3 mg/dL (ref 8.4–10.5)
GFR calc Af Amer: >90 mL/min (ref >90)
Glucose, Bld: 88 mg/dL (ref 70–99)
Potassium: 3.8 mEq/L (ref 3.5–5.1)
Total Protein: 7.2 g/dL (ref 6.0–8.3)

## 2011-11-06 MED ORDER — POTASSIUM CHLORIDE IN NACL 20-0.9 MEQ/L-% IV SOLN
INTRAVENOUS | Status: DC
  Administered 2011-11-06 – 2011-11-09 (×5): via INTRAVENOUS
  Filled 2011-11-06 (×8): qty 1000

## 2011-11-06 MED ORDER — SODIUM CHLORIDE 0.9 % IV SOLN
500.0000 mg | INTRAVENOUS | Status: DC
  Administered 2011-11-07 (×2): 0.5 g via INTRAVENOUS
  Filled 2011-11-06 (×3): qty 0.5

## 2011-11-06 MED ORDER — IOHEXOL 300 MG/ML  SOLN
100.0000 mL | Freq: Once | INTRAMUSCULAR | Status: AC | PRN
  Administered 2011-11-06: 100 mL via INTRAVENOUS

## 2011-11-06 MED ORDER — MORPHINE SULFATE 2 MG/ML IJ SOLN
2.0000 mg | INTRAMUSCULAR | Status: DC | PRN
  Administered 2011-11-06 – 2011-11-07 (×5): 2 mg via INTRAVENOUS
  Filled 2011-11-06 (×4): qty 1

## 2011-11-06 MED ORDER — PANTOPRAZOLE SODIUM 40 MG IV SOLR
40.0000 mg | Freq: Every day | INTRAVENOUS | Status: DC
  Administered 2011-11-06 – 2011-11-08 (×3): 40 mg via INTRAVENOUS
  Filled 2011-11-06 (×4): qty 40

## 2011-11-06 MED ORDER — MORPHINE SULFATE 2 MG/ML IJ SOLN
INTRAMUSCULAR | Status: AC
  Administered 2011-11-06: 2 mg via INTRAVENOUS
  Filled 2011-11-06: qty 1

## 2011-11-06 MED ORDER — ONDANSETRON HCL 4 MG/2ML IJ SOLN
4.0000 mg | Freq: Four times a day (QID) | INTRAMUSCULAR | Status: DC | PRN
  Administered 2011-11-06 – 2011-11-08 (×5): 4 mg via INTRAVENOUS
  Filled 2011-11-06 (×5): qty 2

## 2011-11-06 MED ORDER — PNEUMOCOCCAL VAC POLYVALENT 25 MCG/0.5ML IJ INJ
0.5000 mL | INJECTION | INTRAMUSCULAR | Status: AC
  Filled 2011-11-06: qty 0.5

## 2011-11-06 NOTE — Telephone Encounter (Signed)
Patient called status post lap chole on Friday complaining of nausea, vomiting and abdominal pain. Patient had some nausea this weekend, but has been vomiting off and on all day today. Feels warm but doesn't have a thermometer and has not taken her temperature. She is also having sharp and throbbing right sided abdominal pain. Pain medicine is not helping patient. Dr Andrey Campanile paged. Per Dr Andrey Campanile have patient see urgent office doctor. Appt made with Dr Biagio Quint for today. Patient aware.

## 2011-11-06 NOTE — Progress Notes (Signed)
Patient ID: Patricia Sanchez, female   DOB: 10-Feb-1991, 21 y.o.   MRN: [id number]  Chief Complaint  Patient presents with  . Follow-up    Lap chole 11/01/11 - adbominal pain - N&V    HPI Patricia Sanchez is a 21 y.o. female.  This patient presents today for evaluation of increasing abdominal pain and intractable nausea and vomiting approximately 5 days status post laparoscopic cholecystectomy. Her initial procedure was complicated with a mesenteric hematoma due to Optiview entry but she did very well overnight after her procedure and was discharged home on postoperative #1.  She states that her pain was gradually increasing the last few days but yesterday after trying a piece of pizza began having severe nausea and vomiting and since then she has not been able to keep even water down. She has thrown up twice since waiting in the clinic and she states that she has some right-sided quadrant abdominal pain. She has felt some chills but did not measure any fever and she is afebrile on exam today. She had a small bowel movement but otherwise hasn't moved her bowels since her surgery as well. HPI  Past Medical History  Diagnosis Date  . Hearing impaired person   . Diarrhea   . Nausea & vomiting   . Seizures     PER PATIENT---states has seizures when gets very angry x 1-2 x year- states  will  pass out and lasts about 15 min- no loss of urine, hasnt ever been seen for this or diagnosed    Past Surgical History  Procedure Date  . Tonsillectomy 2001  . Cholecystectomy 11/01/2011    Procedure: LAPAROSCOPIC CHOLECYSTECTOMY;  Surgeon: Atilano Ina, MD;  Location: [facility] ORS;  Service: General;  Laterality: N/A;  with intraoperative cholangiogram    Family History  Problem Relation Age of Onset  . Hypertension Mother   . Other Father     murder    Social History History  Substance Use Topics  . Smoking status: Current Everyday Smoker -- 0.2 packs/day for 2 years    Types: Cigarettes  .  Smokeless tobacco: Never Used  . Alcohol Use: Yes     socially    No Known Allergies  Current Outpatient Prescriptions  Medication Sig Dispense Refill  . oxyCODONE (OXY IR/ROXICODONE) 5 MG immediate release tablet Take 1 tablet (5 mg total) by mouth every 4 (four) hours as needed.  40 tablet  0    Review of Systems Review of Systems All other review of systems negative or noncontributory except as stated in the HPI  Blood pressure 136/70, pulse 68, temperature 97.6 F (36.4 C), temperature source Temporal, resp. rate 16, height 5\' 11"  (1.803 m), weight 190 lb 6.4 oz (86.365 kg), last menstrual period 10/16/2011.  Physical Exam Physical Exam  Vitals reviewed. Constitutional: She is oriented to person, place, and time. She appears well-developed and well-nourished. No distress.  HENT:  Head: Normocephalic and atraumatic.  Mouth/Throat: No oropharyngeal exudate.  Eyes: Conjunctivae are normal. Pupils are equal, round, and reactive to light. No scleral icterus.  Neck: Normal range of motion. No tracheal deviation present.  Cardiovascular: Normal rate and regular rhythm.   Pulmonary/Chest: Effort normal. No stridor. No respiratory distress. She has no wheezes.  Abdominal: Soft. She exhibits no distension and no mass. There is tenderness. There is guarding.       Incisions are without infection, but she is tender in the RUQ and greatest tenderness in the RLQ,  She has no  left sided tenderness  Neurological: She is alert and oriented to person, place, and time.  Skin: Skin is warm and dry. No rash noted. She is not diaphoretic. No erythema. No pallor.  Psychiatric: She has a normal mood and affect. Her behavior is normal. Judgment and thought content normal.    Data Reviewed   Assessment    Intractable nausea and vomiting status post lap cholecystectomy  She has had persistent nausea and vomiting for 24 hours and unable to keep any liquids down. She also has some increasing  right-sided abdominal pain which is concerning for possible postoperative complication.  Though she looks pretty good overall, and her vital signs are normal, since she is recent postop with increasing abdominal pain and nausea and vomiting I have recommended direct admission to the hospital for IV fluid hydration and laboratory workup. We will also set her up for a CT scan of the abdomen to evaluate for possible bile leak, abscess, bowel injury, or hematoma. Plan for pain control and control of her nausea and vomiting as well. I discussed this with her mother and the patient through a sign language interpreter and we'll plan for direct admit to the hospital.    Plan    We will admit to [facility] for pain control and nausea control and IV hydration and further workup.       Lodema Pilot DAVID 11/06/2011, 4:56 PM

## 2011-11-06 NOTE — Telephone Encounter (Signed)
Patricia Sanchez with Communication services will have an interpreter here for the patient at 4pm for her appt today.

## 2011-11-07 ENCOUNTER — Inpatient Hospital Stay (HOSPITAL_COMMUNITY): Payer: Medicare Other

## 2011-11-07 LAB — URINALYSIS, DIPSTICK ONLY
Hgb urine dipstick: NEGATIVE
Nitrite: NEGATIVE
Protein, ur: NEGATIVE mg/dL
Specific Gravity, Urine: 1.039 — ABNORMAL HIGH (ref 1.005–1.030)
Urobilinogen, UA: 1 mg/dL (ref 0.0–1.0)

## 2011-11-07 LAB — COMPREHENSIVE METABOLIC PANEL
ALT: 17 U/L (ref 0–35)
AST: 15 U/L (ref 0–37)
Alkaline Phosphatase: 48 U/L (ref 39–117)
CO2: 21 mEq/L (ref 19–32)
Calcium: 9 mg/dL (ref 8.4–10.5)
Chloride: 104 mEq/L (ref 96–112)
GFR calc Af Amer: >90 mL/min (ref >90)
GFR calc non Af Amer: 85 mL/min — ABNORMAL LOW (ref >90)
Glucose, Bld: 88 mg/dL (ref 70–99)
Potassium: 3.6 mEq/L (ref 3.5–5.1)
Sodium: 137 mEq/L (ref 135–145)
Total Bilirubin: 0.7 mg/dL (ref 0.3–1.2)

## 2011-11-07 LAB — CBC
HCT: 35.4 % — ABNORMAL LOW (ref 36.0–46.0)
MCH: 29.8 pg (ref 26.0–34.0)
MCHC: 33.6 g/dL (ref 30.0–36.0)
RDW: 13.6 % (ref 11.5–15.5)

## 2011-11-07 MED ORDER — PROMETHAZINE HCL 25 MG/ML IJ SOLN
12.5000 mg | Freq: Four times a day (QID) | INTRAMUSCULAR | Status: DC | PRN
  Administered 2011-11-07: 12.5 mg via INTRAVENOUS
  Filled 2011-11-07 (×2): qty 1

## 2011-11-07 MED ORDER — WHITE PETROLATUM GEL
Status: AC
  Administered 2011-11-07: 20:00:00
  Filled 2011-11-07: qty 5

## 2011-11-07 NOTE — Progress Notes (Signed)
UR of chart completed.  

## 2011-11-07 NOTE — Progress Notes (Signed)
Subjective: Feels better. Has some pain in RLQ. Just feels a little weak. Very small amount of emesis this am.   Objective: Vital signs in last 24 hours: Temp:  [97.6 F (36.4 C)-98.9 F (37.2 C)] 98.3 F (36.8 C) (01/17 0628) Pulse Rate:  [61-68] 67  (01/17 0628) Resp:  [16-18] 18  (01/17 0628) BP: (118-136)/(59-70) 118/59 mmHg (01/17 0628) SpO2:  [94 %-100 %] 100 % (01/17 0628) Weight:  [190 lb 6.4 oz (86.365 kg)] 190 lb 6.4 oz (86.365 kg) (01/17 0700) Last BM Date: 11/06/11  Intake/Output from previous day: 01/16 0701 - 01/17 0700 In: -  Out: 400 [Urine:400] Intake/Output this shift:   Non-toxic, not ill-appearing Alert, nad cta Reg Soft, nd, +bs. Incisions c/d/i. Very mild TTP in RLQ. No Rt. No guarding No jaundice/scleral icterus  Lab Results:   Basename 11/07/11 0504 11/06/11 1823  WBC 6.1 7.0  HGB 11.9* 12.0  HCT 35.4* 35.4*  PLT 202 186   BMET  Basename 11/07/11 0504 11/06/11 1823  NA 137 137  K 3.6 3.8  CL 104 104  CO2 21 24  GLUCOSE 88 88  BUN 8 9  CREATININE 0.94 0.92  CALCIUM 9.0 9.3   PT/INR No results found for this basename: LABPROT:2,INR:2 in the last 72 hours ABG No results found for this basename: PHART:2,PCO2:2,PO2:2,HCO3:2 in the last 72 hours  Studies/Results: Ct Abdomen Pelvis W Contrast  11/06/2011  *RADIOLOGY REPORT*  Clinical Data: Nausea, vomiting, abdominal pain, 5-day status post Laproscopic cholecystectomy.  CT ABDOMEN AND PELVIS WITH CONTRAST  Technique:  Multidetector CT imaging of the abdomen and pelvis was performed following the standard protocol during bolus administration of intravenous contrast.  Contrast: OMNIPAQUE IOHEXOL 300 MG/ML IV SOLN  Comparison: 11/01/2011 intraoperative cholangiogram  Findings: Lung bases clear.  Normal heart size.  No pericardial or pleural effusion.  Abdomen:  Cholecystectomy clips noted.  Small amount of fluid in the gallbladder fossa and along the porta hepatis extending along the  posterior right liver margin.  This is concerning for bile leak.  Scattered foci of pneumoperitoneum along the right liver margin, image 36 and beneath the abdominal wall, image 54.  Right pericolic gutter heterogeneous inflammation/fluid extending along the cecum and right colon.  Scattered foci of retroperitoneal gas also evident.  Although a component of this could be postoperative and related to a possible bile leak or blood products, a bowel injury cannot be excluded possibly involving the right colon.  Liver, pancreas, spleen, adrenal glands, and kidneys are within normal limits and demonstrate no acute process.  Pelvis:  Normal appendix demonstrated, containing contrast on images 55-59.  Small amount of pelvic free fluid noted.  Uterus and ovaries are normal in size.  Urinary bladder is decompressed.   Pneumoperitoneum noted anterior to the bladder and extending along the anterior right pelvis back to the cecum.  No inguinal abnormality or hernia.  No acute osseous abnormality.  IMPRESSION: Cholecystectomy.  Small amount gallbladder fossa and perihepatic fluid extending to the right pericolic gutter, bile leak not excluded.  Heterogeneous inflammatory fluid and possibly blood products in the right pericolic gutter around the right colon and cecum a component of which may be related to bile leak and or hemorrhage however there is pneumoperitoneum and air within the right retroperitoneal space therefore bowel injury involving the right colon cannot be excluded.  Normal appendix  Small amount pelvic ascites  Critical Value/emergent results were called by telephone at the time of interpretation on 11/06/2011  at  11:15 p.m.  to  Dr. Lindie Spruce, who verbally acknowledged these results.  Original Report Authenticated By: Judie Petit. Ruel Favors, M.D.    Anti-infectives: Anti-infectives     Start     Dose/Rate Route Frequency Ordered Stop   11/06/11 2345   ertapenem (INVANZ) 0.5 g in sodium chloride 0.9 % 50 mL IVPB          500 mg 100 mL/hr over 30 Minutes Intravenous Every 24 hours 11/06/11 2328            Assessment/Plan: s/p lap chole 6 days ago with colon mesentery bleeding Readmitted with Abd pain, nausea/vomiting  I reviewed CT with the radiologist and reviewed it myself. The heterogeneous fluid is consistent with the bleeding during the surgery last week. However, the air within the retroperitoneum is abnormal.    I discussed the ct findings and (re-discussed) the operative findings with the pt, pt's mom, and pt's sister with interpreter.  The pt doesn't clinically appear to have a bowel injury - she is not tachycardiac, febrile, or have an elevated wbc. However, I recommended at repeat CT abd/pelvis with oral and rectal contrast to evaluate her colon and see if there is any extravasation of  Contrast.    Keep NPO except ice chips IVF Check CT abd/pelvis with oral/rectal contrast  Mary Sella. Andrey Campanile, MD, FACS General, Bariatric, & Minimally Invasive Surgery [facility] Surgery, [place]    LOS: 1 day    Atilano Ina 11/07/2011

## 2011-11-07 NOTE — Progress Notes (Signed)
Clinical Social Worker completed psychosocial assessment, please see shadow chart for details.  CSW reviewed Advanced Directives.  Pt and/or family to indicate to CSW when pt is ready to sign HCPOA document.   Angelia Mould, MSW, [place] [phone]

## 2011-11-08 LAB — CBC
MCH: 29.3 pg (ref 26.0–34.0)
MCV: 88.9 fL (ref 78.0–100.0)
Platelets: 205 10*3/uL (ref 150–400)
RDW: 13.4 % (ref 11.5–15.5)
WBC: 5.1 10*3/uL (ref 4.0–10.5)

## 2011-11-08 LAB — DIFFERENTIAL
Basophils Absolute: 0 10*3/uL (ref 0.0–0.1)
Basophils Relative: 0 % (ref 0–1)
Lymphocytes Relative: 31 % (ref 12–46)
Monocytes Absolute: 0.4 10*3/uL (ref 0.1–1.0)
Neutro Abs: 2.9 10*3/uL (ref 1.7–7.7)
Neutrophils Relative %: 56 % (ref 43–77)

## 2011-11-08 LAB — BASIC METABOLIC PANEL
CO2: 21 mEq/L (ref 19–32)
Calcium: 8.8 mg/dL (ref 8.4–10.5)
Creatinine, Ser: 0.94 mg/dL (ref 0.50–1.10)
GFR calc non Af Amer: 85 mL/min — ABNORMAL LOW (ref >90)
Glucose, Bld: 89 mg/dL (ref 70–99)
Sodium: 139 mEq/L (ref 135–145)

## 2011-11-08 MED ORDER — OXYCODONE-ACETAMINOPHEN 5-325 MG PO TABS: 1.0000 | ORAL_TABLET | ORAL | Status: DC | PRN

## 2011-11-08 MED ORDER — DIPHENHYDRAMINE HCL 25 MG PO CAPS
25.0000 mg | ORAL_CAPSULE | Freq: Once | ORAL | Status: AC
  Administered 2011-11-08: 25 mg via ORAL
  Filled 2011-11-08: qty 1

## 2011-11-08 MED ORDER — PROMETHAZINE HCL 25 MG PO TABS: 12.5000 mg | ORAL_TABLET | Freq: Four times a day (QID) | ORAL | Status: DC | PRN

## 2011-11-08 MED ORDER — DOCUSATE SODIUM 100 MG PO CAPS
100.0000 mg | ORAL_CAPSULE | Freq: Two times a day (BID) | ORAL | Status: DC
  Administered 2011-11-08 – 2011-11-09 (×2): 100 mg via ORAL
  Filled 2011-11-08 (×2): qty 1

## 2011-11-08 NOTE — Progress Notes (Signed)
Pt complained of nausea. I explained to patient that she should not eat crackers if she is having nausea. Patient signed to friend in room who then wrote on paper that pt started feeling nauseated after eating some chips. I re-iterated that patient should not be eating if she is nauseated. Also medicated patient with 4 mg IV Zofran.

## 2011-11-08 NOTE — Progress Notes (Signed)
Clinical Social Worker met with pt at bedside and inquired about signing paperwork, pt indicated that she would like to wait to sign paperwork.  CSW to continue to follow and assist as needed.   If pt requests to sign paperwork over weekend, please call weekend CSW Josie (ph #[phone])   Angelia Mould, MSW, [place] [phone]

## 2011-11-08 NOTE — Progress Notes (Signed)
Subjective: Pt states she feels better. Much less nausea. Denies abd pain. No emesis. +flatus. No BM.  (done via writing on paper)  Objective: Vital signs in last 24 hours: Temp:  [98.4 F (36.9 C)-98.6 F (37 C)] 98.6 F (37 C) (01/18 1417) Pulse Rate:  [71-79] 77  (01/18 1417) Resp:  [16-19] 19  (01/18 1417) BP: (114-128)/(54-72) 124/72 mmHg (01/18 1417) SpO2:  [100 %] 100 % (01/18 1417) Last BM Date: 11/07/11  Intake/Output from previous day: 01/17 0701 - 01/18 0700 In: 4815.9 [P.O.:480; I.V.:4335.9] Out: 850 [Urine:850] Intake/Output this shift:    Alert, nad cta  Reg abd soft, nd, +BS. Incisions c/d/i. abd NT. Essentially no TTP on rt side  Lab Results:   Basename 11/08/11 0540 11/07/11 0504  WBC 5.1 6.1  HGB 11.6* 11.9*  HCT 35.2* 35.4*  PLT 205 202   BMET  Basename 11/08/11 0540 11/07/11 0504  NA 139 137  K 4.4 3.6  CL 108 104  CO2 21 21  GLUCOSE 89 88  BUN 7 8  CREATININE 0.94 0.94  CALCIUM 8.8 9.0   PT/INR No results found for this basename: LABPROT:2,INR:2 in the last 72 hours ABG No results found for this basename: PHART:2,PCO2:2,PO2:2,HCO3:2 in the last 72 hours  Studies/Results: Ct Abdomen Pelvis Wo Contrast  11/07/2011  *RADIOLOGY REPORT*  Clinical Data: Nausea and abdominal pain after cholecystectomy  CT ABDOMEN AND PELVIS WITHOUT CONTRAST  Technique:  Multidetector CT imaging of the abdomen and pelvis was performed following the standard protocol without intravenous contrast.  Comparison: November 06, 2011  Findings:  Small amount of fluid within the gallbladder fossa persists but has decreased slightly.  Free air and fluid along the right pericolic gutter have decreased slightly.  No new pneumoperitoneum is identified.  The colon is well opacified with contrast, and there is no evidence of contrast extravasation. There is a moderate amount of free fluid within the pelvis but this is also decreased.  The wall thickening of the cecum has  improved.  IMPRESSION: There has been an interval decrease in the amount of fluid within the gallbladder fossa, along the right paracolic gutter, and within the pelvis.  Pneumoperitoneum is also decreasing.  Colonic wall thickening has improved, as well.  There is no extravasation of contrast from the colon.  Original Report Authenticated By: Brandon Melnick, M.D.   Ct Abdomen Pelvis W Contrast  11/06/2011  *RADIOLOGY REPORT*  Clinical Data: Nausea, vomiting, abdominal pain, 5-day status post Laproscopic cholecystectomy.  CT ABDOMEN AND PELVIS WITH CONTRAST  Technique:  Multidetector CT imaging of the abdomen and pelvis was performed following the standard protocol during bolus administration of intravenous contrast.  Contrast: OMNIPAQUE IOHEXOL 300 MG/ML IV SOLN  Comparison: 11/01/2011 intraoperative cholangiogram  Findings: Lung bases clear.  Normal heart size.  No pericardial or pleural effusion.  Abdomen:  Cholecystectomy clips noted.  Small amount of fluid in the gallbladder fossa and along the porta hepatis extending along the posterior right liver margin.  This is concerning for bile leak.  Scattered foci of pneumoperitoneum along the right liver margin, image 36 and beneath the abdominal wall, image 54.  Right pericolic gutter heterogeneous inflammation/fluid extending along the cecum and right colon.  Scattered foci of retroperitoneal gas also evident.  Although a component of this could be postoperative and related to a possible bile leak or blood products, a bowel injury cannot be excluded possibly involving the right colon.  Liver, pancreas, spleen, adrenal glands, and kidneys are  within normal limits and demonstrate no acute process.  Pelvis:  Normal appendix demonstrated, containing contrast on images 55-59.  Small amount of pelvic free fluid noted.  Uterus and ovaries are normal in size.  Urinary bladder is decompressed.   Pneumoperitoneum noted anterior to the bladder and extending along the  anterior right pelvis back to the cecum.  No inguinal abnormality or hernia.  No acute osseous abnormality.  IMPRESSION: Cholecystectomy.  Small amount gallbladder fossa and perihepatic fluid extending to the right pericolic gutter, bile leak not excluded.  Heterogeneous inflammatory fluid and possibly blood products in the right pericolic gutter around the right colon and cecum a component of which may be related to bile leak and or hemorrhage however there is pneumoperitoneum and air within the right retroperitoneal space therefore bowel injury involving the right colon cannot be excluded.  Normal appendix  Small amount pelvic ascites  Critical Value/emergent results were called by telephone at the time of interpretation on 11/06/2011  at 11:15 p.m.  to  Dr. Lindie Spruce, who verbally acknowledged these results.  Original Report Authenticated By: Judie Petit. Ruel Favors, M.D.    Anti-infectives: Anti-infectives     Start     Dose/Rate Route Frequency Ordered Stop   11/06/11 2345   ertapenem (INVANZ) 0.5 g in sodium chloride 0.9 % 50 mL IVPB  Status:  Discontinued        500 mg 100 mL/hr over 30 Minutes Intravenous Every 24 hours 11/06/11 2328 11/08/11 1521          Assessment/Plan: s/p lap chole with ioc with mesenteric injury 1 week ago readmitted with nausea/rt abd pain  Repeat CT with rectal and oral contrast negative for extravasation of contrast. Also pneumoperitoneum had decreased in the interim between the 2 scans.  Pt remains afebrile, normal wbc, improving abd exam.  D/c abx since no evidence of bowel injury Adv diet Hopefully home Saturday  Mary Sella. Andrey Campanile, MD, FACS General, Bariatric, & Minimally Invasive Surgery [facility], [place]    LOS: 2 days    Atilano Ina 11/08/2011

## 2011-11-09 ENCOUNTER — Encounter (HOSPITAL_COMMUNITY): Payer: Self-pay | Admitting: General Surgery

## 2011-11-09 DIAGNOSIS — R1031 Right lower quadrant pain: Secondary | ICD-10-CM | POA: Diagnosis present

## 2011-11-09 DIAGNOSIS — R112 Nausea with vomiting, unspecified: Secondary | ICD-10-CM | POA: Diagnosis present

## 2011-11-09 DIAGNOSIS — Z9889 Other specified postprocedural states: Secondary | ICD-10-CM | POA: Diagnosis present

## 2011-11-09 NOTE — Progress Notes (Signed)
Reviewed all discharge instructions with pt and family, copy given.

## 2011-11-11 ENCOUNTER — Telehealth (INDEPENDENT_AMBULATORY_CARE_PROVIDER_SITE_OTHER): Payer: Self-pay | Admitting: General Surgery

## 2011-11-11 NOTE — Telephone Encounter (Signed)
Message copied by Liliana Cline on Mon Nov 11, 2011  1:57 PM ------      Message from: Gaynelle Adu M      Created: Sat Nov 09, 2011 10:44 AM       Make appt for pt to see me in 2 weeks            Thanks,       wilson

## 2011-11-11 NOTE — Telephone Encounter (Signed)
Called patient with no answer. Left message with appt date and time with interpreter and informed patient to call back if this is not a good date.

## 2011-11-12 NOTE — Discharge Summary (Signed)
Physician Discharge Summary  Patient ID: Patricia Sanchez MRN: [id number] DOB/AGE: [birth year] 20 y.o.  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 She had had  Mesenteric hematoma form in her ascending colon due to an Optiview trocar. She had been kept overnight and discharged home on postoperative day 1. Because she had ongoing nausea and vomiting in the office she was sent to the hospital for admission for IV hydration as well as for CT scan. The CT scan demonstrated a small amount of fluid in her gallbladder fossa as well as a heterogeneous fluid collection in her right paracolic gutter as well as some air in the right retroperitoneal space. Her labs were all normal. She had no leukocytosis. She was not tachycardic or febrile. Her liver function tests were normal. Her hemoglobin was stable and unchanged from her discharge on Saturday. She did not have signs of peritonitis on abdominal exam. She was started on empiric Invanz therapy. The following morning she underwent a repeat CT scan of her abdomen and pelvis with oral and rectal contrast to further evaluate the air seen in the right retroperitoneum in order to rule out a colonic injury. On the repeat CT scan, there is no extravasation of contrast from the colon. There was also a decrease in the right paracolic gutter and gallbladder fossa fluid. There is also a decrease in the air in the right  retroperitoneum. Her antibiotics were stopped since she remained afebrile and without an elevated white blood cell count. Her nausea resolved on hospital day 2. Her diet was advanced. She no longer complained of nausea or right-sided abdominal pain. She was ambulating without difficulty. On Day of discharge she was tolerating a regular diet. She remained afebrile and not tachycardic. Her abdominal exam was normal. She was deemed stable for discharge.  Consults: none  Significant Diagnostic Studies: labs: cbc, cmet and radiology: CT scan: See above  Treatments: IV hydration, antibiotics: Invanz x2 days, analgesia: Morphine and percocet and antiemetics  Discharge Exam: Blood pressure 129/61, pulse 81, temperature 98.4 F (36.9 C), temperature source Oral, resp. rate 16, height 5\' 11"  (1.803 m), weight 190 lb 6.4 oz (86.365 kg), last menstrual period 10/17/2011, SpO2 100.00%.  Well-developed well-nourished African American female in no apparent distress Pulmonary-lungs are clear to auscultation Cardiac-regular rate and rhythm Abdomen-soft, nontender, nondistended. Well-healed trocar incisions. No evidence of superficial wound infection. Extremities-no edema Skin-no jaundice No scleral icterus   Disposition: Home or Self Care  Discharge Orders    Future Appointments: Provider: Department: Dept Phone: Center:   11/19/2011 10:00 AM Atilano Ina, MD [facility] [phone] None     Future Orders Please Complete By Expires   Diet general      Increase activity slowly        Medication List  As of 11/12/2011  9:55 AM   TAKE these medications         acetaminophen 500 MG tablet   Commonly known as: TYLENOL   Take 1,000 mg by mouth every 6 (six) hours as needed. For pain  oxyCODONE 5 MG immediate release tablet   Commonly known as: Oxy IR/ROXICODONE   Take 5 mg by mouth every 4 (four) hours as needed. For pain           Follow-up Information    Follow up with Atilano Ina, MD in 2 weeks.   Contact information:   [employer] [address] [place] [address] [phone]         Mary Sella. Andrey Campanile, MD, FACS General, Bariatric, & Minimally Invasive Surgery [facility], [place]  Signed: Atilano Ina 11/12/2011, 9:55 AM

## 2011-11-19 ENCOUNTER — Encounter (INDEPENDENT_AMBULATORY_CARE_PROVIDER_SITE_OTHER): Payer: Self-pay | Admitting: General Surgery

## 2011-11-19 ENCOUNTER — Ambulatory Visit (INDEPENDENT_AMBULATORY_CARE_PROVIDER_SITE_OTHER): Payer: Federal, State, Local not specified - PPO | Admitting: General Surgery

## 2011-11-19 VITALS — BP 112/62 | HR 78 | Temp 97.6°F | Resp 16 | Ht 71.0 in | Wt 191.0 lb

## 2011-11-19 DIAGNOSIS — Z09 Encounter for follow-up examination after completed treatment for conditions other than malignant neoplasm: Secondary | ICD-10-CM

## 2011-11-19 NOTE — Progress Notes (Signed)
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 She came into the office on January 16 complaining of right lower quadrant pain as well as persistent nausea and vomiting. She was admitted and a CT scan was done which demonstrated some fluid in her right paracolic gutter as well as in her pelvis along with some pneumoperitoneum in her right retroperitoneum. All of her labs were normal. A repeat CT scan the following morning with oral and rectal contrast demonstrated no signs of contrast extravasation. Moreover, the pneumoperitoneum had significantly improved since the previous evening. Her nausea and vomiting resolved. Her right lower quadrant pain resolved. She was started on a diet which she tolerated.  She comes in today for followup. She states that she's been doing great. She denies any fevers, chills, nausea, vomiting, abdominal pain, diarrhea, or constipation. She reports an excellent appetite.  Physical Exam: BP 112/62  Pulse 78  Temp(Src) 97.6 F (36.4 C) (Temporal)  Resp 16  Ht 5\' 11"  (1.803 m)  Wt 191 lb (86.637 kg)  BMI 26.64 kg/m2  LMP 10/17/2011  Gen: alert, NAD, non-toxic appearing Pupils: equal, no scleral icterus Pulm: Lungs clear to auscultation, symmetric chest rise CV: regular rate and rhythm Abd: soft, nontender, nondistended. Well-healed trocar sites. No cellulitis. No incisional hernia Ext: no edema, no calf tenderness Skin: no rash, no jaundice  Pathology: Chronic cholecystitis  Assessment and Plan: Status post laparoscopic cholecystectomy with interoperative cholangiogram  She appears to doing very well. I'm happy all of her symptoms have resolved.  I have released to full activity. She will followup on as-needed basis  Patricia Sanchez. Andrey Campanile, MD, FACS General, Bariatric, & Minimally Invasive Surgery [facility], [place]

## 2011-11-19 NOTE — Patient Instructions (Signed)
You may take a bath.  You can resume full activities

## 2011-12-11 ENCOUNTER — Telehealth (INDEPENDENT_AMBULATORY_CARE_PROVIDER_SITE_OTHER): Payer: Self-pay

## 2011-12-11 LAB — COMPREHENSIVE METABOLIC PANEL
AST: 13 U/L (ref 0–37)
Albumin: 4.6 g/dL (ref 3.5–5.2)
Alkaline Phosphatase: 45 U/L (ref 39–117)
Calcium: 9.3 mg/dL (ref 8.4–10.5)
Chloride: 107 mEq/L (ref 96–112)
Potassium: 4.4 mEq/L (ref 3.5–5.3)
Sodium: 139 mEq/L (ref 135–145)
Total Protein: 6.8 g/dL (ref 6.0–8.3)

## 2011-12-11 LAB — CBC WITH DIFFERENTIAL/PLATELET
Basophils Absolute: 0 10*3/uL (ref 0.0–0.1)
Basophils Relative: 1 % (ref 0–1)
Hemoglobin: 13.2 g/dL (ref 12.0–15.0)
Lymphocytes Relative: 28 % (ref 12–46)
MCHC: 32.3 g/dL (ref 30.0–36.0)
Monocytes Relative: 5 % (ref 3–12)
Neutro Abs: 3.6 10*3/uL (ref 1.7–7.7)
Neutrophils Relative %: 63 % (ref 43–77)
WBC: 5.7 10*3/uL (ref 4.0–10.5)

## 2011-12-11 NOTE — Telephone Encounter (Signed)
Lab work ordered per Dr Andrey Campanile and patient's mother made aware.

## 2011-12-11 NOTE — Telephone Encounter (Signed)
Pt's mother calling stating pt is still having same symptoms that she had before surgery. The only improvement is the vomiting has stopped. Pt still c/o generalized  abd pain,gas,diarrehia and nausea. No fever. Pt states her diet is very limited due to symptoms.  Pt wants to know what else can be done. Mother advised I will send this msg to Dr Andrey Campanile for review.

## 2011-12-12 ENCOUNTER — Telehealth (INDEPENDENT_AMBULATORY_CARE_PROVIDER_SITE_OTHER): Payer: Self-pay | Admitting: General Surgery

## 2011-12-12 NOTE — Telephone Encounter (Signed)
Patient's mother called to check on lab results. Made her aware they are all normal, but Dr Andrey Campanile has not reviewed them and gave me any direction. Made her aware she would probably need to follow up with GI to investigate why she is having ongoing nausea. She does have an appt with GI on Monday. I told them to keep that and if I heard anything different from Dr Andrey Campanile I would give them a call.

## 2012-01-03 ENCOUNTER — Encounter (HOSPITAL_COMMUNITY): Payer: Self-pay | Admitting: Emergency Medicine

## 2012-01-03 ENCOUNTER — Emergency Department (HOSPITAL_COMMUNITY)
Admission: EM | Admit: 2012-01-03 | Discharge: 2012-01-03 | Disposition: A | Discharge status: Home / Self Care | Payer: Federal, State, Local not specified - PPO | Attending: Emergency Medicine | Admitting: Emergency Medicine

## 2012-01-03 DIAGNOSIS — R10819 Abdominal tenderness, unspecified site: Secondary | ICD-10-CM | POA: Insufficient documentation

## 2012-01-03 DIAGNOSIS — R112 Nausea with vomiting, unspecified: Secondary | ICD-10-CM | POA: Insufficient documentation

## 2012-01-03 DIAGNOSIS — R109 Unspecified abdominal pain: Secondary | ICD-10-CM | POA: Insufficient documentation

## 2012-01-03 DIAGNOSIS — R197 Diarrhea, unspecified: Secondary | ICD-10-CM | POA: Insufficient documentation

## 2012-01-03 DIAGNOSIS — H9190 Unspecified hearing loss, unspecified ear: Secondary | ICD-10-CM | POA: Insufficient documentation

## 2012-01-03 LAB — URINALYSIS, ROUTINE W REFLEX MICROSCOPIC
Specific Gravity, Urine: 1.013 (ref 1.005–1.030)
Urobilinogen, UA: 0.2 mg/dL (ref 0.0–1.0)

## 2012-01-03 LAB — URINE MICROSCOPIC-ADD ON

## 2012-01-03 LAB — COMPREHENSIVE METABOLIC PANEL
ALT: 19 U/L (ref 0–35)
AST: 19 U/L (ref 0–37)
Albumin: 4.6 g/dL (ref 3.5–5.2)
Calcium: 9.7 mg/dL (ref 8.4–10.5)
GFR calc Af Amer: >90 mL/min (ref >90)
Glucose, Bld: 91 mg/dL (ref 70–99)
Sodium: 143 mEq/L (ref 135–145)
Total Protein: 7.8 g/dL (ref 6.0–8.3)

## 2012-01-03 LAB — CBC
MCH: 30 pg (ref 26.0–34.0)
MCHC: 33.3 g/dL (ref 30.0–36.0)
MCV: 90.3 fL (ref 78.0–100.0)
Platelets: 218 10*3/uL (ref 150–400)
RDW: 13.9 % (ref 11.5–15.5)

## 2012-01-03 LAB — DIFFERENTIAL
Basophils Absolute: 0 10*3/uL (ref 0.0–0.1)
Basophils Relative: 0 % (ref 0–1)
Eosinophils Absolute: 0.2 10*3/uL (ref 0.0–0.7)
Eosinophils Relative: 3 % (ref 0–5)

## 2012-01-03 LAB — PREGNANCY, URINE: Preg Test, Ur: NEGATIVE

## 2012-01-03 MED ORDER — RANITIDINE HCL 150 MG PO CAPS: 150.0000 mg | ORAL_CAPSULE | Freq: Every day | ORAL | Status: AC

## 2012-01-03 MED ORDER — PROMETHAZINE HCL 25 MG/ML IJ SOLN
12.5000 mg | Freq: Once | INTRAMUSCULAR | Status: AC
  Administered 2012-01-03: 12.5 mg via INTRAVENOUS
  Filled 2012-01-03: qty 1

## 2012-01-03 MED ORDER — SODIUM CHLORIDE 0.9 % IV BOLUS (SEPSIS)
1000.0000 mL | Freq: Once | INTRAVENOUS | Status: AC
  Administered 2012-01-03: 1000 mL via INTRAVENOUS

## 2012-01-03 MED ORDER — MORPHINE SULFATE 4 MG/ML IJ SOLN
4.0000 mg | Freq: Once | INTRAMUSCULAR | Status: AC
  Administered 2012-01-03: 4 mg via INTRAVENOUS
  Filled 2012-01-03: qty 1

## 2012-01-03 MED ORDER — ONDANSETRON HCL 4 MG/2ML IJ SOLN
4.0000 mg | Freq: Once | INTRAMUSCULAR | Status: AC
  Administered 2012-01-03: 4 mg via INTRAVENOUS
  Filled 2012-01-03: qty 2

## 2012-01-03 MED ORDER — ONDANSETRON 8 MG PO TBDP: ORAL_TABLET | ORAL | Status: DC

## 2012-01-03 NOTE — ED Notes (Signed)
PA at bedside.

## 2012-01-03 NOTE — Discharge Instructions (Signed)
Your blood work today is normal. Continue to try to drink plenty of fluids. Try to eat healthy, avoid spicy, greasy foods, avoid caffeinated drinks. Take zantac daily to prevent reflux. Take zofran in the morning for nausea. Follow up with Dr. Elnoria Howard and with your primary care doctor for further testing.  Nausea and Vomiting Nausea is a sick feeling that often comes before throwing up (vomiting). Vomiting is a reflex where stomach contents come out of your mouth. Vomiting can cause severe loss of body fluids (dehydration). Children and elderly adults can become dehydrated quickly, especially if they also have diarrhea. Nausea and vomiting are symptoms of a condition or disease. It is important to find the cause of your symptoms. CAUSES   Direct irritation of the stomach lining. This irritation can result from increased acid production (gastroesophageal reflux disease), infection, food poisoning, taking certain medicines (such as nonsteroidal anti-inflammatory drugs), alcohol use, or tobacco use.   Signals from the brain.These signals could be caused by a headache, heat exposure, an inner ear disturbance, increased pressure in the brain from injury, infection, a tumor, or a concussion, pain, emotional stimulus, or metabolic problems.   An obstruction in the gastrointestinal tract (bowel obstruction).   Illnesses such as diabetes, hepatitis, gallbladder problems, appendicitis, kidney problems, cancer, sepsis, atypical symptoms of a heart attack, or eating disorders.   Medical treatments such as chemotherapy and radiation.   Receiving medicine that makes you sleep (general anesthetic) during surgery.  DIAGNOSIS Your caregiver may ask for tests to be done if the problems do not improve after a few days. Tests may also be done if symptoms are severe or if the reason for the nausea and vomiting is not clear. Tests may include:  Urine tests.   Blood tests.   Stool tests.   Cultures (to look for  evidence of infection).   X-rays or other imaging studies.  Test results can help your caregiver make decisions about treatment or the need for additional tests. TREATMENT You need to stay well hydrated. Drink frequently but in small amounts.You may wish to drink water, sports drinks, clear broth, or eat frozen ice pops or gelatin dessert to help stay hydrated.When you eat, eating slowly may help prevent nausea.There are also some antinausea medicines that may help prevent nausea. HOME CARE INSTRUCTIONS   Take all medicine as directed by your caregiver.   If you do not have an appetite, do not force yourself to eat. However, you must continue to drink fluids.   If you have an appetite, eat a normal diet unless your caregiver tells you differently.   Eat a variety of complex carbohydrates (rice, wheat, potatoes, bread), lean meats, yogurt, fruits, and vegetables.   Avoid high-fat foods because they are more difficult to digest.   Drink enough water and fluids to keep your urine clear or pale yellow.   If you are dehydrated, ask your caregiver for specific rehydration instructions. Signs of dehydration may include:   Severe thirst.   Dry lips and mouth.   Dizziness.   Dark urine.   Decreasing urine frequency and amount.   Confusion.   Rapid breathing or pulse.  SEEK IMMEDIATE MEDICAL CARE IF:   You have blood or brown flecks (like coffee grounds) in your vomit.   You have black or bloody stools.   You have a severe headache or stiff neck.   You are confused.   You have severe abdominal pain.   You have chest pain or trouble breathing.  You do not urinate at least once every 8 hours.   You develop cold or clammy skin.   You continue to vomit for longer than 24 to 48 hours.   You have a fever.  MAKE SURE YOU:   Understand these instructions.   Will watch your condition.   Will get help right away if you are not doing well or get worse.  Document  Released: 10/07/2005 Document Revised: 09/26/2011 Document Reviewed: 03/06/2011 [facility] Patient Information 2012 [place], [place].

## 2012-01-03 NOTE — ED Notes (Addendum)
Patient complaining of loss of appetite, nausea and vomiting since June of last year.  Patient states that she had her gallbladder removed this year (January) due to her nausea and vomiting and states that her symptoms have not improved since the surgery (patient states that nausea became worse after surgery; patient concerned it is a thyroid issue).  Last emesis this morning; patient states that she vomits upon awaking each morning -- patient states that she does not take antinausea medications at home; most nausea/vomiting episodes are around 6am-10am.  Reports 100 pound weight loss within the last year.  Patient alert and oriented x4; PERRL present.  Patient is deaf; communication done through pen and paper (at bedside).  Upon arrival to room, patient changed into gown.  Will continue to monitor.

## 2012-01-03 NOTE — ED Notes (Signed)
Sign Language interpreter paged.  Mother at bedside upset at this time no interpreter has been at bedside.  Communication with healthcare team has been written communication

## 2012-01-03 NOTE — ED Notes (Signed)
Patient with nausea and vomiting since last January 2012.  Patient had gallbladder out but continues with nausea and vomiting, unable to eat well.  Patient has lost approx 100 lbs in last year due to vomiting.  Patient vomits every morning.  Patient is deaf, able to communicate by writing and reading lips.

## 2012-01-03 NOTE — ED Provider Notes (Signed)
History     CSN: [id number]  Arrival date & time 01/03/12  0865   First MD Initiated Contact with Patient 01/03/12 [phone]      Chief Complaint  Patient presents with  . Emesis    (Consider location/radiation/quality/duration/timing/severity/associated sxs/prior treatment) Patient is a 21 y.o. female presenting with vomiting. The history is provided by the patient. 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  Emesis  This is a chronic problem. The current episode started more than 1 week ago. The problem occurs 5 to 10 times per day. The problem has been gradually worsening. The emesis has an appearance of stomach contents. There has been no fever. Associated symptoms include abdominal pain and diarrhea. Pertinent negatives include no chills and no fever.  Pt states for the last 8 months she has had persistent morning nausea after eating, mainly in the morning. Diffuse abdominal cramping. At times loose stool. States she was seen by her PCP, and surgeon, had cholecystectomy two months ago. Pt states since then her symptoms continued to worsen. She states she has no appetite, and when tries eating something, she always vomits. She is however able to keep fluids down. She also states symptoms are the worst between 6am and 10am, and improve through the day, although, still has nausea when she eats. Pt states she has lost 100lb since all this started 8 mon ago. Pt states she has had colonoscopy, endoscopy, which were both normal, and a CT scan which showed no significant findings. PT denies fever, chills, URI symptoms. States has persistent cramping in entire abdomen. Denies urinary or vaginal symptoms.   Past Medical History  Diagnosis Date  . Hearing impaired person   . Diarrhea   . Nausea & vomiting   . Seizures     PER PATIENT---states has seizures when gets very angry x 1-2 x year- states  will  pass out and lasts  about 15 min- no loss of urine, hasnt ever been seen for this or diagnosed  . Postoperative nausea and vomiting 11/09/2011    Past Surgical History  Procedure Date  . Cholecystectomy 11/01/2011    Procedure: LAPAROSCOPIC CHOLECYSTECTOMY;  Surgeon: Atilano Ina, MD;  Location: [facility];  Service: General;  Laterality: N/A;  with intraoperative cholangiogram  . Tonsillectomy and adenoidectomy 2001  . Wisdom tooth extraction 03/26/2011    Family History  Problem Relation Age of Onset  . Hypertension Mother   . Other Father     murder    History  Substance Use Topics  . Smoking status: Former Smoker -- 1 years    Types: Cigarettes  . Smokeless tobacco: Never Used   Comment: 11/06/11 "1 or 2 cigarettes/day"  . Alcohol Use: Yes     socially    OB History    Grav Para Term Preterm Abortions TAB SAB Ect Mult Living                  Review of Systems  Constitutional: Negative for fever and chills.  Respiratory: Negative.   Cardiovascular: Negative.   Gastrointestinal: Positive for nausea, vomiting, abdominal pain and diarrhea.  Genitourinary: Negative for dysuria, flank pain, vaginal discharge, vaginal pain, menstrual problem and pelvic pain.  Musculoskeletal: Negative.   Skin: Negative.   Neurological: Negative.   Psychiatric/Behavioral: Negative.     Allergies  Review of patient's allergies indicates no known allergies.  Home Medications  No current outpatient prescriptions on file.  BP 129/74  Pulse 73  Temp(Src) 98.1 F (36.7 C) (Oral)  Resp 16  SpO2 98%  LMP 01/02/2012  Physical Exam  Nursing note and vitals reviewed. Constitutional: She is oriented to person, place, and time. She appears well-developed and well-nourished. No distress.  HENT:  Head: Normocephalic.  Eyes: Conjunctivae are normal.  Neck: Neck supple.  Cardiovascular: Normal rate, regular rhythm and normal heart sounds.   Pulmonary/Chest: Effort normal and breath sounds normal. No respiratory  distress.  Abdominal: Soft. Bowel sounds are normal.       RUQ and epigastric tenderness, no guarding, no rebound tenderness  Musculoskeletal: Normal range of motion.  Neurological: She is alert and oriented to person, place, and time.  Skin: Skin is warm and dry.  Psychiatric: She has a normal mood and affect.    ED Course  Procedures (including critical care time)  Pt with chronic nausea/vomiting. Multiple evaluations and imaging/procedures for the same. Looks like Pt has been seen by Dr. Andrey Campanile general surgery and By Dr. Elnoria Howard within last 6 months. Had endoscopy, colonoscopy, cholecystectomy. Pr in NAD currently. Not actively vomiting. VS normal. Will check labs, UA, urine pregnancy  Results for orders placed during the hospital encounter of 01/03/12  CBC      Component Value Range   WBC 5.8  4.0 - 10.5 (K/uL)   RBC 4.73  3.87 - 5.11 (MIL/uL)   Hemoglobin 14.2  12.0 - 15.0 (g/dL)   HCT 40.9  81.1 - 91.4 (%)   MCV 90.3  78.0 - 100.0 (fL)   MCH 30.0  26.0 - 34.0 (pg)   MCHC 33.3  30.0 - 36.0 (g/dL)   RDW 78.2  95.6 - 21.3 (%)   Platelets 218  150 - 400 (K/uL)  DIFFERENTIAL      Component Value Range   Neutrophils Relative 71  43 - 77 (%)   Neutro Abs 4.1  1.7 - 7.7 (K/uL)   Lymphocytes Relative 21  12 - 46 (%)   Lymphs Abs 1.2  0.7 - 4.0 (K/uL)   Monocytes Relative 5  3 - 12 (%)   Monocytes Absolute 0.3  0.1 - 1.0 (K/uL)   Eosinophils Relative 3  0 - 5 (%)   Eosinophils Absolute 0.2  0.0 - 0.7 (K/uL)   Basophils Relative 0  0 - 1 (%)   Basophils Absolute 0.0  0.0 - 0.1 (K/uL)  COMPREHENSIVE METABOLIC PANEL      Component Value Range   Sodium 143  135 - 145 (mEq/L)   Potassium 3.7  3.5 - 5.1 (mEq/L)   Chloride 107  96 - 112 (mEq/L)   CO2 24  19 - 32 (mEq/L)   Glucose, Bld 91  70 - 99 (mg/dL)   BUN 7  6 - 23 (mg/dL)   Creatinine, Ser 0.86  0.50 - 1.10 (mg/dL)   Calcium 9.7  8.4 - 57.8 (mg/dL)   Total Protein 7.8  6.0 - 8.3 (g/dL)   Albumin 4.6  3.5 - 5.2 (g/dL)   AST  19  0 - 37 (U/L)   ALT 19  0 - 35 (U/L)   Alkaline Phosphatase 49  39 - 117 (U/L)   Total Bilirubin 0.4  0.3 - 1.2 (mg/dL)   GFR calc non Af Amer 80 (*) >90 (mL/min)   GFR calc Af Amer >90  >90 (mL/min)  LIPASE, BLOOD      Component Value Range   Lipase 36  11 - 59 (U/L)  URINALYSIS, ROUTINE W REFLEX MICROSCOPIC      Component Value Range   Color, Urine YELLOW  YELLOW    APPearance HAZY (*) CLEAR    Specific Gravity, Urine 1.013  1.005 - 1.030    pH 6.0  5.0 - 8.0    Glucose, UA NEGATIVE  NEGATIVE (mg/dL)   Hgb urine dipstick LARGE (*) NEGATIVE    Bilirubin Urine NEGATIVE  NEGATIVE    Ketones, ur 15 (*) NEGATIVE (mg/dL)   Protein, ur NEGATIVE  NEGATIVE (mg/dL)   Urobilinogen, UA 0.2  0.0 - 1.0 (mg/dL)   Nitrite NEGATIVE  NEGATIVE    Leukocytes, UA TRACE (*) NEGATIVE   PREGNANCY, URINE      Component Value Range   Preg Test, Ur NEGATIVE  NEGATIVE   URINE MICROSCOPIC-ADD ON      Component Value Range   Squamous Epithelial / LPF FEW (*) RARE    WBC, UA 3-6  <3 (WBC/hpf)   RBC / HPF 0-2  <3 (RBC/hpf)   Bacteria, UA RARE  RARE    Urine-Other MUCOUS PRESENT     Pt feeling better. Able to tolerate POs. Will start on anti acids and zofran for nausea. Pt admits to this nausea only in the morning. Will d/c home with follow up.    No diagnosis found.    MDM          Lottie Mussel, PA 01/03/12 [phone]

## 2012-01-03 NOTE — ED Notes (Signed)
Pt writes that she doesn't feel like she can give a urine sample.  She writes that she will call out when she feels like she can give [place] a sample.

## 2012-01-04 NOTE — ED Provider Notes (Signed)
Medical screening examination/treatment/procedure(s) were performed by non-physician practitioner and as supervising physician I was immediately available for consultation/collaboration.   Jaycion Treml Y. Alga Southall, MD 01/04/12 0712 

## 2012-01-06 ENCOUNTER — Other Ambulatory Visit: Payer: Self-pay | Admitting: Gastroenterology

## 2012-01-06 DIAGNOSIS — R197 Diarrhea, unspecified: Secondary | ICD-10-CM

## 2012-01-08 ENCOUNTER — Ambulatory Visit (HOSPITAL_COMMUNITY)
Admission: RE | Admit: 2012-01-08 | Discharge: 2012-01-08 | Disposition: A | Discharge status: Home / Self Care | Payer: Federal, State, Local not specified - PPO | Source: Ambulatory Visit | Attending: Gastroenterology | Admitting: Gastroenterology

## 2012-01-08 DIAGNOSIS — R197 Diarrhea, unspecified: Secondary | ICD-10-CM | POA: Insufficient documentation

## 2012-01-08 DIAGNOSIS — R1110 Vomiting, unspecified: Secondary | ICD-10-CM | POA: Insufficient documentation

## 2012-01-08 DIAGNOSIS — R109 Unspecified abdominal pain: Secondary | ICD-10-CM | POA: Insufficient documentation

## 2012-01-08 MED ORDER — IOHEXOL 300 MG/ML  SOLN
100.0000 mL | Freq: Once | INTRAMUSCULAR | Status: AC | PRN
  Administered 2012-01-08: 100 mL via INTRAVENOUS

## 2012-01-08 MED ORDER — IOHEXOL 300 MG/ML  SOLN
80.0000 mL | Freq: Once | INTRAMUSCULAR | Status: AC | PRN
  Administered 2012-01-08: 80 mL via INTRAVENOUS

## 2012-01-09 ENCOUNTER — Other Ambulatory Visit (HOSPITAL_COMMUNITY): Payer: Federal, State, Local not specified - PPO

## 2012-05-03 ENCOUNTER — Encounter (HOSPITAL_COMMUNITY): Payer: Self-pay | Admitting: Emergency Medicine

## 2012-05-03 ENCOUNTER — Emergency Department (HOSPITAL_COMMUNITY)
Admission: EM | Admit: 2012-05-03 | Discharge: 2012-05-03 | Disposition: A | Discharge status: Home / Self Care | Payer: Medicare Other | Attending: Emergency Medicine | Admitting: Emergency Medicine

## 2012-05-03 DIAGNOSIS — R112 Nausea with vomiting, unspecified: Secondary | ICD-10-CM | POA: Insufficient documentation

## 2012-05-03 DIAGNOSIS — R1013 Epigastric pain: Secondary | ICD-10-CM | POA: Insufficient documentation

## 2012-05-03 DIAGNOSIS — R10816 Epigastric abdominal tenderness: Secondary | ICD-10-CM | POA: Insufficient documentation

## 2012-05-03 LAB — LIPASE, BLOOD: Lipase: 44 U/L (ref 11–59)

## 2012-05-03 LAB — URINE MICROSCOPIC-ADD ON

## 2012-05-03 LAB — COMPREHENSIVE METABOLIC PANEL
BUN: 11 mg/dL (ref 6–23)
CO2: 22 mEq/L (ref 19–32)
Chloride: 105 mEq/L (ref 96–112)
Creatinine, Ser: 1.03 mg/dL (ref 0.50–1.10)
GFR calc non Af Amer: 77 mL/min — ABNORMAL LOW (ref >90)
Total Bilirubin: 0.3 mg/dL (ref 0.3–1.2)

## 2012-05-03 LAB — URINALYSIS, ROUTINE W REFLEX MICROSCOPIC
Bilirubin Urine: NEGATIVE
Specific Gravity, Urine: 1.026 (ref 1.005–1.030)
Urobilinogen, UA: 0.2 mg/dL (ref 0.0–1.0)

## 2012-05-03 LAB — CBC
HCT: 39.9 % (ref 36.0–46.0)
MCV: 90.7 fL (ref 78.0–100.0)
RBC: 4.4 MIL/uL (ref 3.87–5.11)
RDW: 13.8 % (ref 11.5–15.5)
WBC: 6.6 10*3/uL (ref 4.0–10.5)

## 2012-05-03 MED ORDER — GI COCKTAIL ~~LOC~~
30.0000 mL | Freq: Once | ORAL | Status: AC
  Administered 2012-05-03: 30 mL via ORAL
  Filled 2012-05-03: qty 30

## 2012-05-03 MED ORDER — OMEPRAZOLE 20 MG PO CPDR: 20.0000 mg | DELAYED_RELEASE_CAPSULE | Freq: Every day | ORAL | Status: DC

## 2012-05-03 NOTE — ED Provider Notes (Signed)
Complains of abdominal pain onset last night after eating spaghetti. Patient presently hungry. Vomited 3 times. No fever. Feels much improved after treatment with GI cocktail. Suspect gastritis, esophagitis, nonspecific cause of pain plan Prilosec, antacids, followup Dr.Hung if symptoms continue  Doug Sou, MD 05/03/12 1821

## 2012-05-03 NOTE — ED Provider Notes (Signed)
History     CSN: [id number]  Arrival date & time 05/03/12  4098   First MD Initiated Contact with Patient 05/03/12 [phone]      Chief Complaint  Patient presents with  . Abdominal Pain    (Consider location/radiation/quality/duration/timing/severity/associated sxs/prior treatment) The history is provided by the patient.   21 year old female with c/c epigastric abd pain described as burning that she often experiences in the morning, worse for the last 3 mornings, assoc with nausea and emesis of undigested food. Also worsened with eating spaghetti last night. No fever, CP, SOB, or change in bowel habits. Worse with certain foods. No tx attempted in last few days. Seen for similar sx in March with rx for zantac that she stopped taking and feels did not help. Has seen GI in the past for recurrent N/V- Dr Elnoria Howard. Prior abd surgery includes only Lap Chole 10/2011.  Past Medical History  Diagnosis Date  . Hearing impaired person   . Diarrhea   . Nausea & vomiting   . Seizures     PER PATIENT---states has seizures when gets very angry x 1-2 x year- states  will  pass out and lasts about 15 min- no loss of urine, hasnt ever been seen for this or diagnosed  . Postoperative nausea and vomiting 11/09/2011    Past Surgical History  Procedure Date  . Cholecystectomy 11/01/2011    Procedure: LAPAROSCOPIC CHOLECYSTECTOMY;  Surgeon: Atilano Ina, MD;  Location: [facility];  Service: General;  Laterality: N/A;  with intraoperative cholangiogram  . Tonsillectomy and adenoidectomy 2001  . Wisdom tooth extraction 03/26/2011    Family History  Problem Relation Age of Onset  . Hypertension Mother   . Other Father     murder    History  Substance Use Topics  . Smoking status: Former Smoker -- 1 years    Types: Cigarettes  . Smokeless tobacco: Never Used   Comment: 11/06/11 "1 or 2 cigarettes/day"  . Alcohol Use: Yes     socially     Review of Systems 10 systems reviewed and are negative for acute  change except as noted in the HPI.  Allergies  Review of patient's allergies indicates no known allergies.  Home Medications   Current Outpatient Rx  Name Route Sig Dispense Refill  . ONDANSETRON 8 MG PO TBDP  Take one tab SL in the morning as needed for nausea 20 tablet 0  . RANITIDINE HCL 150 MG PO CAPS Oral Take 1 capsule (150 mg total) by mouth daily. 30 capsule 0    BP 129/60  Pulse 77  Temp 98.5 F (36.9 C) (Oral)  Resp 16  SpO2 100%  LMP 05/02/2012  Physical Exam  Nursing note reviewed. Constitutional: She is oriented to person, place, and time. She appears well-developed and well-nourished. No distress.       Vital signs are reviewed and are normal.  HENT:  Head: Normocephalic and atraumatic.  Right Ear: External ear normal.       MMM  Eyes: Conjunctivae are normal.  Neck: Neck supple.  Cardiovascular: Normal rate, regular rhythm and normal heart sounds.   No murmur heard. Pulmonary/Chest: Effort normal and breath sounds normal. No respiratory distress. She has no wheezes.  Abdominal: Soft. Bowel sounds are normal. She exhibits no distension. There is Tenderness: mild to the epigastric region only..  Musculoskeletal: She exhibits no edema and no tenderness.  Neurological: She is alert and oriented to person, place, and time.  Skin: Skin is  warm and dry.    ED Course  Procedures (including critical care time)  Labs Reviewed  URINALYSIS, ROUTINE W REFLEX MICROSCOPIC - Abnormal; Notable for the following:    Hgb urine dipstick MODERATE (*)     All other components within normal limits  COMPREHENSIVE METABOLIC PANEL - Abnormal; Notable for the following:    GFR calc non Af Amer 77 (*)     GFR calc Af Amer 89 (*)     All other components within normal limits  PREGNANCY, URINE  CBC  LIPASE, BLOOD  URINE MICROSCOPIC-ADD ON   No results found.   1. Epigastric pain       MDM  Recurrent epigastric burning with occasional N/V. Non-toxic appearing on exam.  Abd non-surgical Worsening with certain foods, foul taste in morning, as well as other history components most c/w reflux vs gastritis/esophagitis. Doubt SBO given unchanged bowel movements, good BS on exam, only 1 laparoscopic surgery. Labs are all unremarkable. Complete symptom relief with GI cocktail. Pt now hungry. Will d.c home with advice to take maalox TID, prilosec OTC. To f/u with GI as needed.         Shaaron Adler, [place] 05/03/12 1217

## 2012-05-03 NOTE — ED Notes (Signed)
Pt. Stated, and pointed to stomach pain for 3 days with nausea and vomiting

## 2012-05-03 NOTE — ED Provider Notes (Signed)
Medical screening examination/treatment/procedure(s) were conducted as a shared visit with non-physician practitioner(s) and myself.  I personally evaluated the patient during the encounter  Doug Sou, MD 05/03/12 1820

## 2012-05-03 NOTE — ED Notes (Signed)
PT COMPLAINS OF ABD PAIN ONGOING SINCE June, VOMITING, PT ALSO REPORTS HX OF GALL BLADDER SURGERY AND ACID REFLUX AND FEELS THE SAME AS THAT VISIT

## 2013-09-23 ENCOUNTER — Encounter (HOSPITAL_COMMUNITY): Payer: Self-pay | Admitting: Emergency Medicine

## 2013-09-23 ENCOUNTER — Emergency Department (HOSPITAL_COMMUNITY)
Admission: EM | Admit: 2013-09-23 | Discharge: 2013-09-23 | Disposition: A | Discharge status: Home / Self Care | Payer: Medicare Other | Attending: Emergency Medicine | Admitting: Emergency Medicine

## 2013-09-23 DIAGNOSIS — R112 Nausea with vomiting, unspecified: Secondary | ICD-10-CM | POA: Insufficient documentation

## 2013-09-23 DIAGNOSIS — R1110 Vomiting, unspecified: Secondary | ICD-10-CM

## 2013-09-23 DIAGNOSIS — R109 Unspecified abdominal pain: Secondary | ICD-10-CM

## 2013-09-23 DIAGNOSIS — R1084 Generalized abdominal pain: Secondary | ICD-10-CM | POA: Diagnosis not present

## 2013-09-23 DIAGNOSIS — R197 Diarrhea, unspecified: Secondary | ICD-10-CM | POA: Diagnosis not present

## 2013-09-23 DIAGNOSIS — Z8669 Personal history of other diseases of the nervous system and sense organs: Secondary | ICD-10-CM | POA: Insufficient documentation

## 2013-09-23 DIAGNOSIS — Z87891 Personal history of nicotine dependence: Secondary | ICD-10-CM | POA: Insufficient documentation

## 2013-09-23 DIAGNOSIS — Z3202 Encounter for pregnancy test, result negative: Secondary | ICD-10-CM | POA: Insufficient documentation

## 2013-09-23 DIAGNOSIS — Z79899 Other long term (current) drug therapy: Secondary | ICD-10-CM | POA: Insufficient documentation

## 2013-09-23 LAB — CBC WITH DIFFERENTIAL/PLATELET
Basophils Relative: 0 % (ref 0–1)
Eosinophils Absolute: 0.2 10*3/uL (ref 0.0–0.7)
MCH: 30.6 pg (ref 26.0–34.0)
MCHC: 34.1 g/dL (ref 30.0–36.0)
Neutrophils Relative %: 73 % (ref 43–77)
Platelets: 195 10*3/uL (ref 150–400)

## 2013-09-23 LAB — COMPREHENSIVE METABOLIC PANEL
ALT: 17 U/L (ref 0–35)
Albumin: 4 g/dL (ref 3.5–5.2)
Alkaline Phosphatase: 41 U/L (ref 39–117)
BUN: 8 mg/dL (ref 6–23)
Potassium: 4.4 mEq/L (ref 3.5–5.1)
Sodium: 138 mEq/L (ref 135–145)
Total Protein: 7 g/dL (ref 6.0–8.3)

## 2013-09-23 LAB — LIPASE, BLOOD: Lipase: 54 U/L (ref 11–59)

## 2013-09-23 LAB — URINALYSIS, ROUTINE W REFLEX MICROSCOPIC
Bilirubin Urine: NEGATIVE
Nitrite: NEGATIVE
Specific Gravity, Urine: 1.023 (ref 1.005–1.030)
pH: 7.5 (ref 5.0–8.0)

## 2013-09-23 LAB — PREGNANCY, URINE: Preg Test, Ur: NEGATIVE

## 2013-09-23 LAB — URINE MICROSCOPIC-ADD ON

## 2013-09-23 MED ORDER — ONDANSETRON HCL 4 MG/2ML IJ SOLN
4.0000 mg | Freq: Once | INTRAMUSCULAR | Status: AC
  Administered 2013-09-23: 4 mg via INTRAVENOUS
  Filled 2013-09-23: qty 2

## 2013-09-23 MED ORDER — SODIUM CHLORIDE 0.9 % IV BOLUS (SEPSIS)
1000.0000 mL | Freq: Once | INTRAVENOUS | Status: AC
  Administered 2013-09-23: 1000 mL via INTRAVENOUS

## 2013-09-23 MED ORDER — HYDROMORPHONE HCL PF 1 MG/ML IJ SOLN
1.0000 mg | Freq: Once | INTRAMUSCULAR | Status: AC
  Administered 2013-09-23: 1 mg via INTRAVENOUS
  Filled 2013-09-23: qty 1

## 2013-09-23 NOTE — ED Notes (Addendum)
Pt c/o nausea, vomiting, diffuse abdominal pain and diarrhea x 1 week.  Hx of gall bladder removal 2 years prior.  Pt hearing impaired.  Interpreter called.

## 2013-09-23 NOTE — ED Provider Notes (Signed)
CSN: [id number]     Arrival date & time 09/23/13  1010 History   First MD Initiated Contact with Patient 09/23/13 1019     Chief Complaint  Patient presents with  . abdominal pain, emesis, diarrhea    (Consider location/radiation/quality/duration/timing/severity/associated sxs/prior Treatment) HPI Patricia Sanchez Draft is a 22 y.o. female who presents to the emergency department for concern of abdominal pain, vomiting, and diarrhea.  Interpreter used for translation.  Patient reports that she has been having nausea, vomiting, and diarrhea every day for over 2 years now.  Patient has had workup with CT scans as well as upper and lower endoscopies in past.  Gallbladder removed in January of 2013 without resolution of symptoms.  Patient reports that vomiting is NBNB.  Tends to happen 2-3x in morning.  She then tends to get better throughout the rest of the day.  No acute worsening of symptoms.  Patient also with mild diarrhea that occurs 2-3x/day.  No fevers.  Some decreased urination.  No hematuria or dysuria.  No other symptoms.  Past Medical History  Diagnosis Date  . Hearing impaired person   . Diarrhea   . Nausea & vomiting   . Seizures     PER PATIENT---states has seizures when gets very angry x 1-2 x year- states  will  pass out and lasts about 15 min- no loss of urine, hasnt ever been seen for this or diagnosed  . Postoperative nausea and vomiting 11/09/2011   Past Surgical History  Procedure Laterality Date  . Cholecystectomy  11/01/2011    Procedure: LAPAROSCOPIC CHOLECYSTECTOMY;  Surgeon: Atilano Ina, MD;  Location: [facility];  Service: General;  Laterality: N/A;  with intraoperative cholangiogram  . Tonsillectomy and adenoidectomy  2001  . Wisdom tooth extraction  03/26/2011   Family History  Problem Relation Age of Onset  . Hypertension Mother   . Other Father     murder   History  Substance Use Topics  . Smoking status: Former Smoker -- 1 years    Types: Cigarettes  .  Smokeless tobacco: Never Used     Comment: 11/06/11 "1 or 2 cigarettes/day"  . Alcohol Use: Yes     Comment: socially   OB History   Grav Para Term Preterm Abortions TAB SAB Ect Mult Living                 Review of Systems  Constitutional: Negative for fever and chills.  HENT: Negative for congestion and rhinorrhea.   Respiratory: Negative for cough and shortness of breath.   Cardiovascular: Negative for chest pain.  Gastrointestinal: Positive for nausea, vomiting, abdominal pain and diarrhea. Negative for abdominal distention.  Endocrine: Negative for polyuria.  Genitourinary: Negative for dysuria.  Musculoskeletal: Negative for neck pain and neck stiffness.  Skin: Negative for rash.  Neurological: Negative for headaches.  Psychiatric/Behavioral: Negative.     Allergies  Review of patient's allergies indicates no known allergies.  Home Medications   Current Outpatient Rx  Name  Route  Sig  Dispense  Refill  . Probiotic Product (PROBIOTIC DAILY PO)   Oral   Take 1 capsule by mouth daily.         . traMADol (ULTRAM) 50 MG tablet   Oral   Take 100 mg by mouth daily.          BP 126/80  Pulse 62  Temp(Src) 98 F (36.7 C) (Oral)  Resp 16  SpO2 100%  LMP 09/15/2013 Physical Exam  Nursing note and vitals reviewed. Constitutional: She is oriented to person, place, and time. She appears well-developed and well-nourished. No distress.  HENT:  Head: Normocephalic and atraumatic.  Right Ear: External ear normal.  Left Ear: External ear normal.  Nose: Nose normal.  Mouth/Throat: Oropharynx is clear and moist. No oropharyngeal exudate.  Eyes: EOM are normal. Pupils are equal, round, and reactive to light.  Neck: Normal range of motion. Neck supple. No tracheal deviation present.  Cardiovascular: Normal rate.   Pulmonary/Chest: Effort normal and breath sounds normal. No stridor. No respiratory distress. She has no wheezes. She has no rales.  Abdominal: Soft. She  exhibits no distension. There is generalized tenderness. There is no rigidity, no rebound and no guarding.  Musculoskeletal: Normal range of motion.  Neurological: She is alert and oriented to person, place, and time.  Skin: Skin is warm and dry. She is not diaphoretic.    ED Course  Procedures (including critical care time) Labs Review Labs Reviewed  COMPREHENSIVE METABOLIC PANEL - Abnormal; Notable for the following:    GFR calc non Af Amer 78 (*)    All other components within normal limits  URINALYSIS, ROUTINE W REFLEX MICROSCOPIC - Abnormal; Notable for the following:    APPearance CLOUDY (*)    Ketones, ur 40 (*)    Leukocytes, UA SMALL (*)    All other components within normal limits  URINE MICROSCOPIC-ADD ON - Abnormal; Notable for the following:    Squamous Epithelial / LPF FEW (*)    Bacteria, UA MANY (*)    All other components within normal limits  URINE CULTURE  CBC WITH DIFFERENTIAL  LIPASE, BLOOD  PREGNANCY, URINE   Imaging Review No results found.  EKG Interpretation   None       MDM   1. Vomiting   2. Abdominal pain   3. Diarrhea     Patricia Sanchez Draft is a 22 y.o. female who presents to the emergency department for concern presents with abdominal pain, vomiting, and diarrhea x 2 years.  Exam with soft abdomen with mild diffuse tenderness.  No localization.  No indication for repeat CT scan at this time.  Pain and nausea controlled here.  Labs without significant abnormalities.  UA with bacteria, but multiple squams present.  Most c/w contamination.  Recommend f/u with PCP and possible GI referral.  Patient safe for discharge home.  Patient discharged.    Arloa Koh, MD 09/23/13 [phone]

## 2013-09-23 NOTE — ED Provider Notes (Signed)
I saw and evaluated the patient, reviewed the resident's note and I agree with the findings and plan. If applicable, I agree with the resident's interpretation of the EKG.  If applicable, I was present for critical portions of any procedures performed.  2 years of abdominal pain, vomiting, diarrhea.  Previous workup with endoscopy and CT.  "wants answer today". Nontoxic.  Abdomen soft and nontender.  Tolerating PO, nontoxic.   Glynn Octave, MD 09/23/13 [phone]

## 2013-09-24 LAB — URINE CULTURE

## 2013-11-05 ENCOUNTER — Encounter (HOSPITAL_COMMUNITY): Payer: Self-pay | Admitting: Emergency Medicine

## 2013-11-05 ENCOUNTER — Emergency Department (HOSPITAL_COMMUNITY)
Admission: EM | Admit: 2013-11-05 | Discharge: 2013-11-05 | Disposition: A | Discharge status: Home / Self Care | Payer: Medicare Other | Attending: Emergency Medicine | Admitting: Emergency Medicine

## 2013-11-05 DIAGNOSIS — Z3202 Encounter for pregnancy test, result negative: Secondary | ICD-10-CM | POA: Insufficient documentation

## 2013-11-05 DIAGNOSIS — Z8669 Personal history of other diseases of the nervous system and sense organs: Secondary | ICD-10-CM | POA: Insufficient documentation

## 2013-11-05 DIAGNOSIS — R1012 Left upper quadrant pain: Secondary | ICD-10-CM | POA: Diagnosis not present

## 2013-11-05 DIAGNOSIS — R109 Unspecified abdominal pain: Secondary | ICD-10-CM

## 2013-11-05 DIAGNOSIS — H9190 Unspecified hearing loss, unspecified ear: Secondary | ICD-10-CM | POA: Diagnosis not present

## 2013-11-05 DIAGNOSIS — IMO0001 Reserved for inherently not codable concepts without codable children: Secondary | ICD-10-CM | POA: Insufficient documentation

## 2013-11-05 DIAGNOSIS — Z87891 Personal history of nicotine dependence: Secondary | ICD-10-CM | POA: Diagnosis not present

## 2013-11-05 DIAGNOSIS — Z9089 Acquired absence of other organs: Secondary | ICD-10-CM | POA: Insufficient documentation

## 2013-11-05 DIAGNOSIS — R1110 Vomiting, unspecified: Secondary | ICD-10-CM | POA: Diagnosis present

## 2013-11-05 LAB — CBC WITH DIFFERENTIAL/PLATELET
BASOS ABS: 0 10*3/uL (ref 0.0–0.1)
BASOS PCT: 0 % (ref 0–1)
EOS ABS: 0.1 10*3/uL (ref 0.0–0.7)
EOS PCT: 1 % (ref 0–5)
HEMATOCRIT: 39 % (ref 36.0–46.0)
Hemoglobin: 13.4 g/dL (ref 12.0–15.0)
Lymphocytes Relative: 19 % (ref 12–46)
Lymphs Abs: 1.1 10*3/uL (ref 0.7–4.0)
MCH: 31.7 pg (ref 26.0–34.0)
MCHC: 34.4 g/dL (ref 30.0–36.0)
MCV: 92.2 fL (ref 78.0–100.0)
MONO ABS: 0.3 10*3/uL (ref 0.1–1.0)
Monocytes Relative: 6 % (ref 3–12)
Neutro Abs: 4.4 10*3/uL (ref 1.7–7.7)
Neutrophils Relative %: 75 % (ref 43–77)
Platelets: 207 10*3/uL (ref 150–400)
RBC: 4.23 MIL/uL (ref 3.87–5.11)
RDW: 14.1 % (ref 11.5–15.5)
WBC: 5.9 10*3/uL (ref 4.0–10.5)

## 2013-11-05 LAB — URINALYSIS, ROUTINE W REFLEX MICROSCOPIC
Bilirubin Urine: NEGATIVE
GLUCOSE, UA: NEGATIVE mg/dL
Hgb urine dipstick: NEGATIVE
Ketones, ur: NEGATIVE mg/dL
LEUKOCYTES UA: NEGATIVE
Nitrite: NEGATIVE
PROTEIN: NEGATIVE mg/dL
Specific Gravity, Urine: 1.025 (ref 1.005–1.030)
Urobilinogen, UA: 0.2 mg/dL (ref 0.0–1.0)
pH: 7.5 (ref 5.0–8.0)

## 2013-11-05 LAB — POCT PREGNANCY, URINE: PREG TEST UR: NEGATIVE

## 2013-11-05 LAB — COMPREHENSIVE METABOLIC PANEL
ALBUMIN: 4.4 g/dL (ref 3.5–5.2)
ALT: 18 U/L (ref 0–35)
AST: 20 U/L (ref 0–37)
Alkaline Phosphatase: 44 U/L (ref 39–117)
BUN: 9 mg/dL (ref 6–23)
CALCIUM: 9.1 mg/dL (ref 8.4–10.5)
CO2: 24 meq/L (ref 19–32)
CREATININE: 1.15 mg/dL — AB (ref 0.50–1.10)
Chloride: 104 mEq/L (ref 96–112)
GFR calc Af Amer: 77 mL/min — ABNORMAL LOW (ref >90)
GFR, EST NON AFRICAN AMERICAN: 66 mL/min — AB (ref >90)
Glucose, Bld: 90 mg/dL (ref 70–99)
Potassium: 4.5 mEq/L (ref 3.7–5.3)
Sodium: 140 mEq/L (ref 137–147)
TOTAL PROTEIN: 7.6 g/dL (ref 6.0–8.3)
Total Bilirubin: 0.3 mg/dL (ref 0.3–1.2)

## 2013-11-05 LAB — LIPASE, BLOOD: Lipase: 38 U/L (ref 11–59)

## 2013-11-05 MED ORDER — GI COCKTAIL ~~LOC~~
30.0000 mL | Freq: Once | ORAL | Status: AC
  Administered 2013-11-05: 30 mL via ORAL
  Filled 2013-11-05: qty 30

## 2013-11-05 MED ORDER — ONDANSETRON 4 MG PO TBDP
8.0000 mg | ORAL_TABLET | Freq: Once | ORAL | Status: AC
  Administered 2013-11-05: 8 mg via ORAL

## 2013-11-05 MED ORDER — ONDANSETRON 4 MG PO TBDP
ORAL_TABLET | ORAL | Status: AC
  Filled 2013-11-05: qty 2

## 2013-11-05 MED ORDER — PROCHLORPERAZINE EDISYLATE 5 MG/ML IJ SOLN
10.0000 mg | Freq: Once | INTRAMUSCULAR | Status: AC
  Administered 2013-11-05: 10 mg via INTRAVENOUS
  Filled 2013-11-05: qty 2

## 2013-11-05 MED ORDER — ONDANSETRON HCL 4 MG/2ML IJ SOLN
4.0000 mg | Freq: Once | INTRAMUSCULAR | Status: AC
  Administered 2013-11-05: 4 mg via INTRAVENOUS
  Filled 2013-11-05: qty 2

## 2013-11-05 MED ORDER — TRAMADOL HCL 50 MG PO TABS: 50.0000 mg | ORAL_TABLET | Freq: Four times a day (QID) | ORAL | Status: DC | PRN

## 2013-11-05 NOTE — Discharge Instructions (Signed)
Abdominal Pain, Adult °Many things can cause abdominal pain. Usually, abdominal pain is not caused by a disease and will improve without treatment. It can often be observed and treated at home. Your health care provider will do a physical exam and possibly order blood tests and X-rays to help determine the seriousness of your pain. However, in many cases, more time must pass before a clear cause of the pain can be found. Before that point, your health care provider may not know if you need more testing or further treatment. °HOME CARE INSTRUCTIONS  °Monitor your abdominal pain for any changes. The following actions may help to alleviate any discomfort you are experiencing: °· Only take over-the-counter or prescription medicines as directed by your health care provider. °· Do not take laxatives unless directed to do so by your health care provider. °· Try a clear liquid diet (broth, tea, or water) as directed by your health care provider. Slowly move to a bland diet as tolerated. °SEEK MEDICAL CARE IF: °· You have unexplained abdominal pain. °· You have abdominal pain associated with nausea or diarrhea. °· You have pain when you urinate or have a bowel movement. °· You experience abdominal pain that wakes you in the night. °· You have abdominal pain that is worsened or improved by eating food. °· You have abdominal pain that is worsened with eating fatty foods. °SEEK IMMEDIATE MEDICAL CARE IF:  °· Your pain does not go away within 2 hours. °· You have a fever. °· You keep throwing up (vomiting). °· Your pain is felt only in portions of the abdomen, such as the right side or the left lower portion of the abdomen. °· You pass bloody or black tarry stools. °MAKE SURE YOU: °· Understand these instructions.   °· Will watch your condition.   °· Will get help right away if you are not doing well or get worse.   °Document Released: 07/17/2005 Document Revised: 07/28/2013 Document Reviewed: 06/16/2013 °[employer]® Patient  Information ©2014 [employer]. ° °

## 2013-11-05 NOTE — ED Provider Notes (Signed)
CSN: [id number]     Arrival date & time 11/05/13  1444 History   First MD Initiated Contact with Patient 11/05/13 1754     Chief Complaint  Patient presents with  . Emesis   (Consider location/radiation/quality/duration/timing/severity/associated sxs/prior Treatment) Patient is a 23 y.o. female presenting with general illness. The history is provided by the patient. The history is limited by a language barrier. A language interpreter was used.  Illness Severity:  Mild Onset quality:  Gradual Duration:  1 day Timing:  Constant Progression:  Worsening Chronicity:  New Associated symptoms: myalgias and vomiting   Associated symptoms: no abdominal pain, no chest pain, no congestion, no fever, no headaches, no nausea, no rhinorrhea, no shortness of breath and no wheezing     Past Medical History  Diagnosis Date  . Hearing impaired person   . Diarrhea   . Nausea & vomiting   . Seizures     PER PATIENT---states has seizures when gets very angry x 1-2 x year- states  will  pass out and lasts about 15 min- no loss of urine, hasnt ever been seen for this or diagnosed  . Postoperative nausea and vomiting 11/09/2011   Past Surgical History  Procedure Laterality Date  . Cholecystectomy  11/01/2011    Procedure: LAPAROSCOPIC CHOLECYSTECTOMY;  Surgeon: Atilano InaEric M Wilson, MD;  Location: [facility];  Service: General;  Laterality: N/A;  with intraoperative cholangiogram  . Tonsillectomy and adenoidectomy  2001  . Wisdom tooth extraction  03/26/2011   Family History  Problem Relation Age of Onset  . Hypertension Mother   . Other Father     murder   History  Substance Use Topics  . Smoking status: Former Smoker -- 1 years    Types: Cigarettes  . Smokeless tobacco: Never Used     Comment: 11/06/11 "1 or 2 cigarettes/day"  . Alcohol Use: Yes     Comment: socially   OB History   Grav Para Term Preterm Abortions TAB SAB Ect Mult Living                 Review of Systems  Constitutional: Negative  for fever and chills.  HENT: Negative for congestion and rhinorrhea.   Eyes: Negative for redness and visual disturbance.  Respiratory: Negative for shortness of breath and wheezing.   Cardiovascular: Negative for chest pain and palpitations.  Gastrointestinal: Positive for vomiting. Negative for nausea and abdominal pain.  Genitourinary: Negative for dysuria and urgency.  Musculoskeletal: Positive for myalgias. Negative for arthralgias.  Skin: Negative for pallor and wound.  Neurological: Negative for dizziness and headaches.    Allergies  Review of patient's allergies indicates no known allergies.  Home Medications   Current Outpatient Rx  Name  Route  Sig  Dispense  Refill  . Probiotic Product (PROBIOTIC DAILY PO)   Oral   Take 1 capsule by mouth daily.         . traMADol (ULTRAM) 50 MG tablet   Oral   Take 100 mg by mouth daily.          BP 137/63  Pulse 62  Temp(Src) 97.6 F (36.4 C) (Oral)  Resp 16  SpO2 100% Physical Exam  Constitutional: She is oriented to person, place, and time. She appears well-developed and well-nourished. No distress.  HENT:  Head: Normocephalic and atraumatic.  Eyes: EOM are normal. Pupils are equal, round, and reactive to light.  Neck: Normal range of motion. Neck supple.  Cardiovascular: Normal rate and regular rhythm.  Exam reveals no gallop and no friction rub.   No murmur heard. Pulmonary/Chest: Effort normal. She has no wheezes. She has no rales.  Abdominal: Soft. She exhibits no distension. There is tenderness (LUQ). There is no rebound.  Musculoskeletal: She exhibits no edema and no tenderness.  Neurological: She is alert and oriented to person, place, and time.  Skin: Skin is warm and dry. She is not diaphoretic.  Psychiatric: She has a normal mood and affect. Her behavior is normal.    ED Course  Procedures (including critical care time) Labs Review Labs Reviewed  COMPREHENSIVE METABOLIC PANEL - Abnormal; Notable for  the following:    Creatinine, Ser 1.15 (*)    GFR calc non Af Amer 66 (*)    GFR calc Af Amer 77 (*)    All other components within normal limits  URINALYSIS, ROUTINE W REFLEX MICROSCOPIC - Abnormal; Notable for the following:    APPearance HAZY (*)    All other components within normal limits  CBC WITH DIFFERENTIAL  LIPASE, BLOOD  POCT PREGNANCY, URINE   Imaging Review No results found.  EKG Interpretation   None       MDM  No diagnosis found. 23 yo F with recurrent abdominal pain, vomiting and diarrhea.  This has been ongoing for the past two years.  This episode worsening over the last couple days.  Unremarkable abdominal labs, no pregnant.  Belly exam with LUQ tenderness, non surgical abdomen.  Pain completely relieved with compazine and GI cocktail, will d/c home.   I have discussed the diagnosis/risks/treatment options with the patient and caregiver and believe the pt to be eligible for discharge home to follow-up with GI. We also discussed returning to the ED immediately if new or worsening sx occur. We discussed the sx which are most concerning (e.g., worsening pain, fever) that necessitate immediate return. Any new prescriptions provided to the patient are listed below.  Discharge Medication List as of 11/05/2013  8:23 PM    START taking these medications   Details  !! traMADol (ULTRAM) 50 MG tablet Take 1 tablet (50 mg total) by mouth every 6 (six) hours as needed., Starting 11/05/2013, Until Discontinued, Print     !! - Potential duplicate medications found. Please discuss with provider.        Melene Plan, MD 11/06/13 [phone]

## 2013-11-05 NOTE — ED Notes (Signed)
Vomiting and and heart burn x  2 days  Some diarrhea

## 2013-11-06 NOTE — ED Provider Notes (Signed)
Medical screening examination/treatment/procedure(s) were conducted as a shared visit with resident-physician practitioner(s) and myself.  I personally evaluated the patient during the encounter.  Pt is a 23 y.o. female with pmhx as above presenting with recurrent epigastric pain, n/v/d.  Pt found to have unremarkable basic labs.  On PE VSS, pt uncomfortable, but in NAD.  She felt much improved after GI cocktail & compazine.  Doubt acute surgical emergency and feel imaging will be unrevealing. Rec outpt f/u with GI.  Return precautions given for new or worsening symptoms including worsening pain, fever.  1. Abdominal pain         Shanna CiscoMegan E Docherty, MD 11/06/13 [phone]

## 2013-11-17 ENCOUNTER — Ambulatory Visit (INDEPENDENT_AMBULATORY_CARE_PROVIDER_SITE_OTHER): Payer: Medicare Other

## 2013-11-17 VITALS — Ht 71.0 in | Wt 185.0 lb

## 2013-11-17 DIAGNOSIS — M779 Enthesopathy, unspecified: Secondary | ICD-10-CM

## 2013-11-17 DIAGNOSIS — M2040 Other hammer toe(s) (acquired), unspecified foot: Secondary | ICD-10-CM

## 2013-11-17 DIAGNOSIS — Q742 Other congenital malformations of lower limb(s), including pelvic girdle: Secondary | ICD-10-CM

## 2013-11-17 DIAGNOSIS — M2010 Hallux valgus (acquired), unspecified foot: Secondary | ICD-10-CM

## 2013-11-17 DIAGNOSIS — R52 Pain, unspecified: Secondary | ICD-10-CM

## 2013-11-17 NOTE — Patient Instructions (Signed)
Pre-Operative Instructions  Congratulations, you have decided to take an important step to improving your quality of life.  You can be assured that the doctors of [facility] will be with you every step of the way.  1. Plan to be at the surgery center/hospital at least 1 (one) hour prior to your scheduled time unless otherwise directed by the surgical center/hospital staff.  You must have a responsible adult accompany you, remain during the surgery and drive you home.  Make sure you have directions to the surgical center/hospital and know how to get there on time. 2. For hospital based surgery you will need to obtain a history and physical form from your family physician within 1 month prior to the date of surgery- we will give you a form for you primary physician.  3. We make every effort to accommodate the date you request for surgery.  There are however, times where surgery dates or times have to be moved.  We will contact you as soon as possible if a change in schedule is required.   4. No Aspirin/Ibuprofen for one week before surgery.  If you are on aspirin, any non-steroidal anti-inflammatory medications (Mobic, Aleve, Ibuprofen) you should stop taking it 7 days prior to your surgery.  You make take Tylenol  For pain prior to surgery.  5. Medications- If you are taking daily heart and blood pressure medications, seizure, reflux, allergy, asthma, anxiety, pain or diabetes medications, make sure the surgery center/hospital is aware before the day of surgery so they may notify you which medications to take or avoid the day of surgery. 6. No food or drink after midnight the night before surgery unless directed otherwise by surgical center/hospital staff. 7. No alcoholic beverages 24 hours prior to surgery.  No smoking 24 hours prior to or 24 hours after surgery. 8. Wear loose pants or shorts- loose enough to fit over bandages, boots, and casts. 9. No slip on shoes, sneakers are best. 10. Bring  your boot with you to the surgery center/hospital.  Also bring crutches or a walker if your physician has prescribed it for you.  If you do not have this equipment, it will be provided for you after surgery. 11. If you have not been contracted by the surgery center/hospital by the day before your surgery, call to confirm the date and time of your surgery. 12. Leave-time from work may vary depending on the type of surgery you have.  Appropriate arrangements should be made prior to surgery with your employer. 13. Prescriptions will be provided immediately following surgery by your doctor.  Have these filled as soon as possible after surgery and take the medication as directed. 14. Remove nail polish on the operative foot. 15. Wash the night before surgery.  The night before surgery wash the foot and leg well with the antibacterial soap provided and water paying special attention to beneath the toenails and in between the toes.  Rinse thoroughly with water and dry well with a towel.  Perform this wash unless told not to do so by your physician.  Enclosed: 1 Ice pack (please put in freezer the night before surgery)   1 Hibiclens skin cleaner or any antibacterial liquid soap is acceptable   Pre-op Instructions  If you have any questions regarding the instructions, do not hesitate to call our office.  [place]: [address] [place], [place] [address] [phone]  [place]: [address] [place], [place] [address] [phone]  [place]: [address]  [place], [place] [address] [phone]  Dr. Santiago Bumpers DPM, Dr. Cristie Hem DPM Dr. Alvan Dame DPM, Dr. Ardelle Anton DPM, Dr. Marlowe Aschoff DPM

## 2013-11-17 NOTE — Progress Notes (Signed)
   Subjective:    Patient ID: Patricia GamblesAshanti J Allen Draft, female    DOB: 03-08-1991, 23 y.o.   MRN: [id number]  HPI Comments: '' BOTH BUNION ON MY FEET ARE HURTING.''  Foot Pain This is a new problem. The current episode started more than 1 year ago. The problem occurs constantly. The problem has been rapidly worsening. Associated symptoms include nausea and vomiting. The symptoms are aggravated by walking and standing. She has tried nothing for the symptoms. The treatment provided no relief.      Review of Systems  Gastrointestinal: Positive for nausea and vomiting.  All other systems reviewed and are negative.       Objective:   Physical Exam Neurovascular status is intact with pedal pulses palpable bilateral. Epicritic and proprioceptive sensations intact and symmetric bilateral. Both feet have notable bunion deformity lateral deviation of hallux x-rays reveal increased IM angle greater than 12 sesamoid positions for hallux abductus angle greater than 20. There is also semirigid contractures second digits proximal IP joint bilateral left more so than right. There is pain both on range of motion palpation first MTP area left foot. Patient has tried NSAIDs changing shoes pads cushioning with little or no success or improvement. Currently using tramadol as needed for pain. Remainder of exam otherwise unremarkable noncontributory no signs of fracture no other osseous abnormality noted dermatologically no open wounds ulcerations she's keratoses or pinch callus the hallux is noted.       Assessment & Plan:  Assessment this time is hallux abductovalgus deformity bilateral left more so than right left is more symptomatic. Patient also is hammertoe deformity second digit with rigid contracture of toe left foot. Capsulitis with associated first MTP area due to bunion and hammertoe deformities. Plan at this time discussed options for treatment including steroid injections NSAID therapy changing shoes  much of which is RE tried patient at this time has elected surgical intervention for correction of bunion deformity left foot first and eventually her right foot. Patient is seen through the interpreter as she is deaf all questions asked medication are answered at this time we discussed the risks Patient alternatives at this time consent form signed and surgery scheduled at her convenience plan for followup for which 3 months before considering surgery on the contralateral foot. Patient is made aware that she will be in air fracture boot for 1 month following the surgery with limited standing and walking although she will be able to weight-bear. Understands is no guarantee valor pain will resolve and she has mild arthropathy associated with the first MTP joint are ready. Consent form signed and surgery scheduled her convenience next  Alvan Dameichard Arleth Mccullar DPM

## 2013-11-22 DIAGNOSIS — M779 Enthesopathy, unspecified: Secondary | ICD-10-CM

## 2013-11-22 DIAGNOSIS — M2010 Hallux valgus (acquired), unspecified foot: Secondary | ICD-10-CM

## 2013-11-22 DIAGNOSIS — M6240 Contracture of muscle, unspecified site: Secondary | ICD-10-CM

## 2013-11-22 DIAGNOSIS — M2040 Other hammer toe(s) (acquired), unspecified foot: Secondary | ICD-10-CM

## 2013-11-26 NOTE — Telephone Encounter (Signed)
I spoke with pt's interpreter and she states the pt is complaining of stomachache, dehydration, slight cough on and off, and chest pain with fever 99.1 to 100.0 last 2 days.  Dr Ralene CorkSikora states get pt to the urgernt care, she may have the flu and they may be able to prescribe medications.  Pt through her interpreter said she would go to the urgent care.

## 2013-11-30 ENCOUNTER — Ambulatory Visit (INDEPENDENT_AMBULATORY_CARE_PROVIDER_SITE_OTHER): Payer: Medicare Other

## 2013-11-30 VITALS — BP 109/77 | HR 56 | Resp 12

## 2013-11-30 DIAGNOSIS — Q742 Other congenital malformations of lower limb(s), including pelvic girdle: Secondary | ICD-10-CM

## 2013-11-30 DIAGNOSIS — Z9889 Other specified postprocedural states: Secondary | ICD-10-CM

## 2013-11-30 DIAGNOSIS — M2010 Hallux valgus (acquired), unspecified foot: Secondary | ICD-10-CM

## 2013-11-30 DIAGNOSIS — M2040 Other hammer toe(s) (acquired), unspecified foot: Secondary | ICD-10-CM

## 2013-11-30 NOTE — Patient Instructions (Signed)
ICE INSTRUCTIONS  Apply ice or cold pack to the affected area at least 3 times a day for 10-15 minutes each time.  You should also use ice after prolonged activity or vigorous exercise.  Do not apply ice longer than 20 minutes at one time.  Always keep a cloth between your skin and the ice pack to prevent burns.  Being consistent and following these instructions will help control your symptoms.  We suggest you purchase a gel ice pack because they are reusable and do bit leak.  Some of them are designed to wrap around the area.  Use the method that works best for you.  Here are some other suggestions for icing.   Use a frozen bag of peas or corn-inexpensive and molds well to your body, usually stays frozen for 10 to 20 minutes.  Wet a towel with cold water and squeeze out the excess until it's damp.  Place in a bag in the freezer for 20 minutes. Then remove and use.  When walking in the air fracture boot try not to walk on your heel. Instead relax your foot and let the shoe due to work. Which are foot flat to the ground without holding up your toes are fighting against her bandages. Take small short steps and with the boot roll on the ground  Remember to exercise your great toe joint 100 times a day, removing the boot moving the toe up and down and then reapplying the boot do that 3 or 4 times daily.

## 2013-11-30 NOTE — Progress Notes (Signed)
   Subjective:    Patient ID: Patricia Sanchez Draft, female    DOB: August 09, 1991, 23 y.o.   MRN: [id number]  HPI 'DOS 2 /2/15 '' LT FOOT IS STILL SORE.'' Patient been trying to walk on her heels maybe fighting against her bandages and dressings.    Review of Systems no new findings or changes noted     Objective:   Physical Exam Neurovascular status is intact pedal pulses palpable mild postoperative edema and ecchymosis is noted consistent with postop course. Incision clean dry well coapted dressings intact and dry range of motion of the hallux is adequate but 30 with some slight tenderness consistent with postop course. X-rays reveal good clinical and radiographic position of the hallux and second digit with intact pin fixation first MTP area as well as fifth toe.       Assessment & Plan:  Assessment good postop progress noted patient's dry sterile dressing reapplied maintain dressing for one week return in one week for followup for suture removal in 4 weeks followup from now for x-rays and pin removal as planned. Patient advised to walk in a relaxed fashion not holding her toes up also advised and range of motion exercises for her great toe joint.  Alvan Dameichard Jyl [place]ico DPM

## 2013-12-08 ENCOUNTER — Ambulatory Visit (INDEPENDENT_AMBULATORY_CARE_PROVIDER_SITE_OTHER): Payer: Medicare Other | Admitting: Podiatry

## 2013-12-08 ENCOUNTER — Encounter: Payer: Self-pay | Admitting: Podiatry

## 2013-12-08 VITALS — BP 154/64 | HR 82 | Resp 12

## 2013-12-08 DIAGNOSIS — M2040 Other hammer toe(s) (acquired), unspecified foot: Secondary | ICD-10-CM

## 2013-12-08 DIAGNOSIS — M2010 Hallux valgus (acquired), unspecified foot: Secondary | ICD-10-CM

## 2013-12-08 MED ORDER — OXYCODONE-ACETAMINOPHEN 5-325 MG PO TABS: 1.0000 | ORAL_TABLET | Freq: Three times a day (TID) | ORAL | Status: DC | PRN

## 2013-12-08 NOTE — Progress Notes (Signed)
Subjective:     Patient ID: Patricia Sanchez, female   DOB: [birth year], 23 y.o.   MRN: [id number]  HPI patient presents with caregiver stating I'm doing okay with my left foot but still getting pain at night. 2 weeks after having forefoot bunionectomy and digital fusion left foot  Review of Systems     Objective:   Physical Exam Neurovascular status intact with negative Homans sign noted and patient well oriented x3 with interpreter with her. Incision sites on the left foot are healing well with good alignment of the hallux and second toe    Assessment:     Healing well post bunion and digital correction left    Plan:     Stitches are removed and sterile dressing reapplied with plantar flexion of the second toe. Advised on leaving this on for the next several weeks and return in 3 weeks for pin removal by Dr. Ralene CorkSikora. Did give her 20 more Percocet just to take at night for the pain she experiences and advised on elevation and continued compression

## 2013-12-19 DIAGNOSIS — S66809A Unspecified injury of other specified muscles, fascia and tendons at wrist and hand level, unspecified hand, initial encounter: Secondary | ICD-10-CM

## 2013-12-19 HISTORY — DX: Unspecified injury of other specified muscles, fascia and tendons at wrist and hand level, unspecified hand, initial encounter: S66.809A

## 2013-12-20 ENCOUNTER — Emergency Department (HOSPITAL_COMMUNITY): Payer: Medicare Other

## 2013-12-20 ENCOUNTER — Encounter (HOSPITAL_COMMUNITY): Payer: Self-pay | Admitting: Emergency Medicine

## 2013-12-20 ENCOUNTER — Emergency Department (HOSPITAL_COMMUNITY)
Admission: EM | Admit: 2013-12-20 | Discharge: 2013-12-21 | Disposition: A | Discharge status: Home / Self Care | Payer: Medicare Other | Attending: Emergency Medicine | Admitting: Emergency Medicine

## 2013-12-20 DIAGNOSIS — F411 Generalized anxiety disorder: Secondary | ICD-10-CM | POA: Insufficient documentation

## 2013-12-20 DIAGNOSIS — S61409A Unspecified open wound of unspecified hand, initial encounter: Secondary | ICD-10-CM | POA: Insufficient documentation

## 2013-12-20 DIAGNOSIS — W261XXA Contact with sword or dagger, initial encounter: Secondary | ICD-10-CM

## 2013-12-20 DIAGNOSIS — Z23 Encounter for immunization: Secondary | ICD-10-CM | POA: Insufficient documentation

## 2013-12-20 DIAGNOSIS — S66909A Unspecified injury of unspecified muscle, fascia and tendon at wrist and hand level, unspecified hand, initial encounter: Principal | ICD-10-CM

## 2013-12-20 DIAGNOSIS — S61209A Unspecified open wound of unspecified finger without damage to nail, initial encounter: Secondary | ICD-10-CM | POA: Insufficient documentation

## 2013-12-20 DIAGNOSIS — Y9389 Activity, other specified: Secondary | ICD-10-CM | POA: Insufficient documentation

## 2013-12-20 DIAGNOSIS — W260XXA Contact with knife, initial encounter: Secondary | ICD-10-CM | POA: Insufficient documentation

## 2013-12-20 DIAGNOSIS — R5383 Other fatigue: Secondary | ICD-10-CM

## 2013-12-20 DIAGNOSIS — S61419A Laceration without foreign body of unspecified hand, initial encounter: Secondary | ICD-10-CM

## 2013-12-20 DIAGNOSIS — S66929A Laceration of unspecified muscle, fascia and tendon at wrist and hand level, unspecified hand, initial encounter: Secondary | ICD-10-CM

## 2013-12-20 DIAGNOSIS — H9190 Unspecified hearing loss, unspecified ear: Secondary | ICD-10-CM | POA: Insufficient documentation

## 2013-12-20 DIAGNOSIS — R5381 Other malaise: Secondary | ICD-10-CM | POA: Insufficient documentation

## 2013-12-20 DIAGNOSIS — Y929 Unspecified place or not applicable: Secondary | ICD-10-CM | POA: Insufficient documentation

## 2013-12-20 DIAGNOSIS — Z87891 Personal history of nicotine dependence: Secondary | ICD-10-CM | POA: Insufficient documentation

## 2013-12-20 MED ORDER — CEPHALEXIN 500 MG PO CAPS: 500.0000 mg | ORAL_CAPSULE | Freq: Four times a day (QID) | ORAL | Status: DC

## 2013-12-20 MED ORDER — LORAZEPAM 1 MG PO TABS
1.0000 mg | ORAL_TABLET | Freq: Once | ORAL | Status: AC
Start: 2013-12-20 — End: 2013-12-20
  Administered 2013-12-20: 1 mg via ORAL
  Filled 2013-12-20: qty 2

## 2013-12-20 MED ORDER — OXYCODONE-ACETAMINOPHEN 5-325 MG PO TABS
1.0000 | ORAL_TABLET | Freq: Once | ORAL | Status: AC
  Administered 2013-12-20: 1 via ORAL
  Filled 2013-12-20: qty 1

## 2013-12-20 MED ORDER — IBUPROFEN 800 MG PO TABS: 800.0000 mg | ORAL_TABLET | Freq: Three times a day (TID) | ORAL | Status: DC

## 2013-12-20 MED ORDER — TETANUS-DIPHTH-ACELL PERTUSSIS 5-2.5-18.5 LF-MCG/0.5 IM SUSP
0.5000 mL | Freq: Once | INTRAMUSCULAR | Status: AC
  Administered 2013-12-20: 0.5 mL via INTRAMUSCULAR
  Filled 2013-12-20: qty 0.5

## 2013-12-20 MED ORDER — HYDROCODONE-ACETAMINOPHEN 5-325 MG PO TABS: 1.0000 | ORAL_TABLET | ORAL | Status: DC | PRN

## 2013-12-20 NOTE — ED Provider Notes (Signed)
CSN: [id number]     Arrival date & time 12/20/13  1934 History  This chart was scribed for non-physician practitioner, Mellody Drown, PA-C,working with Laray Anger, DO, by Karle Plumber, ED Scribe.  This patient was seen in room TR10C/TR10C and the patient's care was started at 8:49 PM.  Chief Complaint  Patient presents with  . Laceration   The history is provided by the patient and a significant other. A language interpreter was used.   HPI Comments:  Patricia Sanchez Draft is a 23 y.o. hearing impaired female who presents to the Emergency Department complaining of a right hand laceration to the third, fourth, and fifth digits with a knife while cutting steak PTA. She reports severe pain and associated bleeding. She states extension of the fingers makes the pain worse. She denies numbness. The patient reports last tetanus is unknown.  Past Medical History  Diagnosis Date  . Hearing impaired person   . Diarrhea   . Nausea & vomiting   . Seizures     PER PATIENT---states has seizures when gets very angry x 1-2 x year- states  will  pass out and lasts about 15 min- no loss of urine, hasnt ever been seen for this or diagnosed  . Postoperative nausea and vomiting 11/09/2011   Past Surgical History  Procedure Laterality Date  . Cholecystectomy  11/01/2011    Procedure: LAPAROSCOPIC CHOLECYSTECTOMY;  Surgeon: Atilano Ina, MD;  Location: [facility];  Service: General;  Laterality: N/A;  with intraoperative cholangiogram  . Tonsillectomy and adenoidectomy  2001  . Wisdom tooth extraction  03/26/2011   Family History  Problem Relation Age of Onset  . Hypertension Mother   . Other Father     murder   History  Substance Use Topics  . Smoking status: Former Smoker -- 1 years    Types: Cigarettes  . Smokeless tobacco: Never Used     Comment: 11/06/11 "1 or 2 cigarettes/day"  . Alcohol Use: Yes     Comment: socially   OB History   Grav Para Term Preterm Abortions TAB SAB Ect Mult  Living                 Review of Systems  Constitutional: Negative for fever and chills.  Skin: Positive for wound.  Neurological: Positive for weakness. Negative for numbness.  All other systems reviewed and are negative.   Allergies  Review of patient's allergies indicates no known allergies.  Home Medications  No current outpatient prescriptions on file. Triage Vitals: BP 123/79  Pulse 71  Temp(Src) 98.1 F (36.7 C) (Oral)  Resp 22  SpO2 100% Physical Exam  Nursing note and vitals reviewed. Constitutional: She is oriented to person, place, and time. She appears well-developed and well-nourished.  HENT:  Head: Normocephalic and atraumatic.  Eyes: EOM are normal.  Neck: Normal range of motion.  Cardiovascular: Normal rate.   Good cap refills.  Pulmonary/Chest: Effort normal.  Musculoskeletal: Normal range of motion.       Right hand: She exhibits laceration. She exhibits normal capillary refill. Normal sensation noted.       Hands: Small laceration to intermediate phalanges of the third digit. 1 cm lacerations to the proximal phalanges fourth and fifth digits. Decrese ROM with flexion. Minimal bleeding.  Neurological: She is alert and oriented to person, place, and time.  Sensations intact.  Skin: Skin is warm and dry.  Psychiatric: Her speech is normal and behavior is normal. Her mood appears anxious.  ED Course  LACERATION REPAIR Date/Time: 12/21/2013 12:03 AM Performed by: Clabe SealPARKER, Hendrik Donath M Authorized by: Clabe SealPARKER, Sulma Ruffino M Consent: Verbal consent obtained. Risks and benefits: risks, benefits and alternatives were discussed Consent given by: patient Patient understanding: patient states understanding of the procedure being performed Patient consent: the patient's understanding of the procedure matches consent given Procedure consent: procedure consent matches procedure scheduled Imaging studies: imaging studies available Required items: required blood products,  implants, devices, and special equipment available Patient identity confirmed: verbally with patient Time out: Immediately prior to procedure a "time out" was called to verify the correct patient, procedure, equipment, support staff and site/side marked as required. Body area: upper extremity Location details: right small finger Laceration length: 1.3 cm Foreign bodies: no foreign bodies Tendon involvement: complex (Explored wound did not visualize the tendon. [name] is unable to flex the didget.) Nerve involvement: none Vascular damage: no Anesthesia: digital block Local anesthetic: lidocaine 2% without epinephrine Anesthetic total: 5 ml Patient sedated: no Preparation: Patient was prepped and draped in the usual sterile fashion. Irrigation solution: saline Irrigation method: syringe Amount of cleaning: standard Debridement: none Skin closure: 3-0 Prolene Number of sutures: 2 Technique: simple Approximation: loose Approximation difficulty: simple Dressing: gauze roll Patient tolerance: Patient tolerated the procedure well with no immediate complications.   (including critical care time)  LACERATION REPAIR Performed by: Clabe SealPARKER, Ascher Schroepfer M Authorized by: Clabe SealPARKER, Leevon Upperman M Consent: Verbal consent obtained. Risks and benefits: risks, benefits and alternatives were discussed Consent given by: patient Patient identity confirmed: provided demographic data Prepped and Draped in normal sterile fashion Wound explored  Laceration Location: Right ring finger  Laceration Length: 1 cm  No Foreign Bodies seen or palpated  Anesthesia: digital block  Local anesthetic: lidocaine 2% without epinephrine  Anesthetic total: 5 ml  Irrigation method: syringe Amount of cleaning: standard  Skin closure: Prolene  Number of sutures: 2  Technique: simple interrupted   Patient tolerance: Patient tolerated the procedure well with no immediate complications. COORDINATION OF CARE: 8:58  PM- Will give pt Percocet for pain and Ativan for anxiousness. Will X-Ray right hand to rule out bone involvement. Pt verbalizes through interpreter understanding and agrees to plan.  Medications  LORazepam (ATIVAN) tablet 1 mg (not administered)  Tdap (BOOSTRIX) injection 0.5 mL (0.5 mLs Intramuscular Given 12/20/13 2021)  oxyCODONE-acetaminophen (PERCOCET/ROXICET) 5-325 MG per tablet 1 tablet (1 tablet Oral Given 12/20/13 2020)    Labs Review Labs Reviewed - No data to display Imaging Review Dg Hand Complete Right  12/20/2013   CLINICAL DATA:  Fourth and fifth finger lacerations  EXAM: RIGHT HAND - COMPLETE 3+ VIEW  COMPARISON:  None.  FINDINGS: The bones of the right hand are adequately mineralized. There is no evidence of an acute fracture or dislocation. No foreign bodies within the soft tissues are demonstrated. Soft tissue lucencies over the proximal phalanges of the fourth and fifth digits are consistent with known lacerations. There is old deformity of the shaft of the fifth metacarpal.  IMPRESSION: There is no acute bony abnormality of the right hand. Soft tissue lacerations over the proximal phalanges of the fourth and fifth digits are present.   Electronically Signed   By: David  Swazil[place]   On: 12/20/2013 21:45     EKG Interpretation None      MDM   Final diagnoses:  Hand laceration involving tendon   Pt presents with a laceration to Right hand, PE shows decrease flexion.  Will obtain XR to evaluate possible bone involvement.  Tetanus updated.  The patient is very anxious and is unable to hold her hand still for full evaluation,  ativan for anxiety.  XR There is no acute bony abnormality of the right hand. Soft tissue lacerations over the proximal phalanges of the fourth and fifth digits are present. Discussed findings with Dr. Izora Ribas who advises loose aproximation, splinting and follow up in the office tomorrow. Laceration repairs tolerated well.  Unable to visualize tendon  involvement during laceration repair. Discussed imaging results, and treatment plan with the patient. Return precautions given. Reports understanding and no other concerns at this time.  Patient is stable for discharge at this time.  Meds given in ED:  Medications  Tdap (BOOSTRIX) injection 0.5 mL (0.5 mLs Intramuscular Given 12/20/13 2021)  oxyCODONE-acetaminophen (PERCOCET/ROXICET) 5-325 MG per tablet 1 tablet (1 tablet Oral Given 12/20/13 2020)  LORazepam (ATIVAN) tablet 1 mg (1 mg Oral Given 12/20/13 2219)    New Prescriptions   CEPHALEXIN (KEFLEX) 500 MG CAPSULE    Take 1 capsule (500 mg total) by mouth 4 (four) times daily.   HYDROCODONE-ACETAMINOPHEN (NORCO/VICODIN) 5-325 MG PER TABLET    Take 1 tablet by mouth every 4 (four) hours as needed.   IBUPROFEN (ADVIL,MOTRIN) 800 MG TABLET    Take 1 tablet (800 mg total) by mouth 3 (three) times daily. Take with food   I personally performed the services described in this documentation, which was scribed in my presence. The recorded information has been reviewed and is accurate.    Clabe Seal, PA-C 12/23/13 1132  Clabe Seal, PA-C 12/23/13 1240

## 2013-12-20 NOTE — ED Notes (Signed)
Patient moved to room 10.

## 2013-12-20 NOTE — ED Notes (Signed)
Lauren, PA-C is at the bedside with the patient, with interpreter.

## 2013-12-20 NOTE — ED Notes (Signed)
Patient has laceration on right and on pinky, ring finger, and first finger.  Gauze in place.  Interpreter needed for hearing impairment.

## 2013-12-20 NOTE — ED Notes (Signed)
PA at bedside.

## 2013-12-20 NOTE — ED Notes (Signed)
Interpreter at bedside.

## 2013-12-20 NOTE — ED Notes (Signed)
Patient transported to X-ray 

## 2013-12-20 NOTE — Discharge Instructions (Signed)
Call Dr. Izora Ribasoley for further evaluation of your finger tomorrow. Keep your finger dry and wear the splint for support. Call for a follow up appointment with a Family or Primary Care Provider.  Return if Symptoms worsen.   Take medication as prescribed.

## 2013-12-20 NOTE — ED Notes (Signed)
Lauren, PA-C is now at the bedside with the patient.

## 2013-12-20 NOTE — ED Notes (Signed)
RN communicated with RN with paper and pen while waiting on intepretter. Patient has normal sensation but decreased ability to move affected fingers on own. Adipose tissue exposed.

## 2013-12-20 NOTE — ED Notes (Signed)
Patient is hearing impaired.  She arrived to room 4 of fast track for laceration on right hand.  She was cut by knife accidentally.  Pinky, ring finger, and first finger are wrapped with gauze on arrival.  Interpreter contacted.  She reports she did not take any pain medication .

## 2013-12-20 NOTE — ED Notes (Signed)
Patient is becoming irritated with the wait, and sent interpreter to inquire.  Rounded on the patient with the interpreter and apologized for the wait, and explained only certain personnel are qualified to work on her laceration.  Patient seems agreeable at this time, and will continue to report any concerns.

## 2013-12-21 MED ORDER — OXYCODONE-ACETAMINOPHEN 5-325 MG PO TABS
1.0000 | ORAL_TABLET | Freq: Once | ORAL | Status: AC
  Administered 2013-12-21: 1 via ORAL
  Filled 2013-12-21: qty 1

## 2013-12-23 NOTE — ED Provider Notes (Signed)
Medical screening examination/treatment/procedure(s) were performed by non-physician practitioner and as supervising physician I was immediately available for consultation/collaboration.   EKG Interpretation None        Laray AngerKathleen M Rhylen Pulido, DO 12/23/13 1411

## 2013-12-27 ENCOUNTER — Telehealth: Payer: Self-pay | Admitting: *Deleted

## 2013-12-27 NOTE — Telephone Encounter (Signed)
Pt presents to office, writes dressing is too loose and the boot too big.  I informed pt I would reapply the Coflex, but the dressing was sterile and should stay in place until seen by Dr Ralene CorkSikora.  LI tried to put the boot back on pt, but she signed it was too big and too heavy.  I told pt that I needed to see if adding air to the Air Fracture Dan HumphreysWalker would help, because her insurance would not cover the AFW and she would have to pay for it.  Pt left in the original AFW.

## 2013-12-28 ENCOUNTER — Encounter (HOSPITAL_BASED_OUTPATIENT_CLINIC_OR_DEPARTMENT_OTHER): Payer: Self-pay | Admitting: *Deleted

## 2013-12-29 ENCOUNTER — Ambulatory Visit (HOSPITAL_BASED_OUTPATIENT_CLINIC_OR_DEPARTMENT_OTHER)
Admission: RE | Admit: 2013-12-29 | Discharge: 2013-12-29 | Disposition: A | Discharge status: Home / Self Care | Payer: Medicare Other | Source: Ambulatory Visit | Attending: General Surgery | Admitting: General Surgery

## 2013-12-29 ENCOUNTER — Encounter (HOSPITAL_BASED_OUTPATIENT_CLINIC_OR_DEPARTMENT_OTHER): Payer: Medicare Other | Admitting: Anesthesiology

## 2013-12-29 ENCOUNTER — Encounter (HOSPITAL_BASED_OUTPATIENT_CLINIC_OR_DEPARTMENT_OTHER): Payer: Self-pay | Admitting: Anesthesiology

## 2013-12-29 ENCOUNTER — Ambulatory Visit (HOSPITAL_BASED_OUTPATIENT_CLINIC_OR_DEPARTMENT_OTHER): Payer: Medicare Other | Admitting: Anesthesiology

## 2013-12-29 ENCOUNTER — Encounter (HOSPITAL_BASED_OUTPATIENT_CLINIC_OR_DEPARTMENT_OTHER)
Admission: RE | Disposition: A | Discharge status: Home / Self Care | Payer: Self-pay | Source: Ambulatory Visit | Attending: General Surgery

## 2013-12-29 DIAGNOSIS — W261XXA Contact with sword or dagger, initial encounter: Secondary | ICD-10-CM

## 2013-12-29 DIAGNOSIS — W260XXA Contact with knife, initial encounter: Secondary | ICD-10-CM | POA: Insufficient documentation

## 2013-12-29 DIAGNOSIS — S61209A Unspecified open wound of unspecified finger without damage to nail, initial encounter: Secondary | ICD-10-CM | POA: Insufficient documentation

## 2013-12-29 DIAGNOSIS — H9190 Unspecified hearing loss, unspecified ear: Secondary | ICD-10-CM | POA: Insufficient documentation

## 2013-12-29 DIAGNOSIS — F17200 Nicotine dependence, unspecified, uncomplicated: Secondary | ICD-10-CM | POA: Insufficient documentation

## 2013-12-29 HISTORY — DX: Unspecified injury of other specified muscles, fascia and tendons at wrist and hand level, unspecified hand, initial encounter: S66.809A

## 2013-12-29 HISTORY — PX: TENDON REPAIR: SHX5111

## 2013-12-29 HISTORY — DX: Unspecified hearing loss, unspecified ear: H91.90

## 2013-12-29 LAB — POCT HEMOGLOBIN-HEMACUE: Hemoglobin: 13.6 g/dL (ref 12.0–15.0)

## 2013-12-29 SURGERY — TENDON REPAIR
Anesthesia: General | Site: Hand | Laterality: Right

## 2013-12-29 MED ORDER — LIDOCAINE HCL (CARDIAC) 20 MG/ML IV SOLN
INTRAVENOUS | Status: DC | PRN
  Administered 2013-12-29: 40 mg via INTRAVENOUS

## 2013-12-29 MED ORDER — CEFAZOLIN SODIUM-DEXTROSE 2-3 GM-% IV SOLR
INTRAVENOUS | Status: AC
  Filled 2013-12-29: qty 50

## 2013-12-29 MED ORDER — OXYCODONE HCL 5 MG PO TABS
ORAL_TABLET | ORAL | Status: AC
  Filled 2013-12-29: qty 1

## 2013-12-29 MED ORDER — ONDANSETRON HCL 4 MG/2ML IJ SOLN
INTRAMUSCULAR | Status: DC | PRN
  Administered 2013-12-29: 4 mg via INTRAVENOUS

## 2013-12-29 MED ORDER — FENTANYL CITRATE 0.05 MG/ML IJ SOLN
INTRAMUSCULAR | Status: DC | PRN
  Administered 2013-12-29 (×5): 25 ug via INTRAVENOUS
  Administered 2013-12-29: 50 ug via INTRAVENOUS

## 2013-12-29 MED ORDER — BUPIVACAINE HCL (PF) 0.5 % IJ SOLN
INTRAMUSCULAR | Status: AC
  Filled 2013-12-29: qty 30

## 2013-12-29 MED ORDER — ONDANSETRON HCL 4 MG/2ML IJ SOLN
INTRAMUSCULAR | Status: AC
  Filled 2013-12-29: qty 2

## 2013-12-29 MED ORDER — DEXAMETHASONE SODIUM PHOSPHATE 4 MG/ML IJ SOLN
INTRAMUSCULAR | Status: DC | PRN
  Administered 2013-12-29: 10 mg via INTRAVENOUS

## 2013-12-29 MED ORDER — OXYCODONE HCL 5 MG PO TABS
5.0000 mg | ORAL_TABLET | Freq: Once | ORAL | Status: AC | PRN
  Administered 2013-12-29: 5 mg via ORAL

## 2013-12-29 MED ORDER — MIDAZOLAM HCL 2 MG/2ML IJ SOLN: 1.0000 mg | INTRAMUSCULAR | Status: DC | PRN

## 2013-12-29 MED ORDER — FENTANYL CITRATE 0.05 MG/ML IJ SOLN
INTRAMUSCULAR | Status: AC
  Filled 2013-12-29: qty 4

## 2013-12-29 MED ORDER — MIDAZOLAM HCL 5 MG/5ML IJ SOLN
INTRAMUSCULAR | Status: DC | PRN
  Administered 2013-12-29: 2 mg via INTRAVENOUS

## 2013-12-29 MED ORDER — OXYCODONE HCL 5 MG/5ML PO SOLN: 5.0000 mg | Freq: Once | ORAL | Status: AC | PRN

## 2013-12-29 MED ORDER — MIDAZOLAM HCL 2 MG/ML PO SYRP: 12.0000 mg | ORAL_SOLUTION | Freq: Once | ORAL | Status: DC | PRN

## 2013-12-29 MED ORDER — MIDAZOLAM HCL 2 MG/2ML IJ SOLN
INTRAMUSCULAR | Status: AC
  Filled 2013-12-29: qty 2

## 2013-12-29 MED ORDER — BUPIVACAINE HCL (PF) 0.5 % IJ SOLN
INTRAMUSCULAR | Status: DC | PRN
  Administered 2013-12-29: 20 mL

## 2013-12-29 MED ORDER — PROMETHAZINE HCL 25 MG/ML IJ SOLN
6.2500 mg | Freq: Once | INTRAMUSCULAR | Status: AC
  Administered 2013-12-29: 6.25 mg via INTRAVENOUS

## 2013-12-29 MED ORDER — PROMETHAZINE HCL 25 MG/ML IJ SOLN
INTRAMUSCULAR | Status: AC
  Filled 2013-12-29: qty 1

## 2013-12-29 MED ORDER — HYDROMORPHONE HCL PF 1 MG/ML IJ SOLN
0.2500 mg | INTRAMUSCULAR | Status: DC | PRN
  Administered 2013-12-29 (×3): 0.5 mg via INTRAVENOUS

## 2013-12-29 MED ORDER — CEFAZOLIN SODIUM-DEXTROSE 2-3 GM-% IV SOLR: 2.0000 g | Freq: Once | INTRAVENOUS | Status: DC

## 2013-12-29 MED ORDER — LACTATED RINGERS IV SOLN
INTRAVENOUS | Status: DC
  Administered 2013-12-29 (×2): via INTRAVENOUS

## 2013-12-29 MED ORDER — ONDANSETRON HCL 4 MG/2ML IJ SOLN
4.0000 mg | Freq: Once | INTRAMUSCULAR | Status: AC | PRN
  Administered 2013-12-29: 4 mg via INTRAVENOUS

## 2013-12-29 MED ORDER — PROPOFOL 10 MG/ML IV BOLUS
INTRAVENOUS | Status: DC | PRN
  Administered 2013-12-29: 200 mg via INTRAVENOUS
  Administered 2013-12-29: 150 mg via INTRAVENOUS

## 2013-12-29 MED ORDER — FENTANYL CITRATE 0.05 MG/ML IJ SOLN: 50.0000 ug | INTRAMUSCULAR | Status: DC | PRN

## 2013-12-29 MED ORDER — HYDROMORPHONE HCL PF 1 MG/ML IJ SOLN
INTRAMUSCULAR | Status: AC
  Filled 2013-12-29: qty 1

## 2013-12-29 SURGICAL SUPPLY — 71 items
BANDAGE ELASTIC 3 VELCRO ST LF (GAUZE/BANDAGES/DRESSINGS) ×6 IMPLANT
BLADE OSC/SAG .038X5.5 CUT EDG (BLADE) IMPLANT
BLADE SURG 15 STRL LF DISP TIS (BLADE) ×3 IMPLANT
BLADE SURG 15 STRL SS (BLADE) ×6
BLADE SURG ROTATE 9660 (MISCELLANEOUS) ×1 IMPLANT
BNDG CMPR 9X4 STRL LF SNTH (GAUZE/BANDAGES/DRESSINGS) ×1
BNDG CONFORM 3 STRL LF (GAUZE/BANDAGES/DRESSINGS) ×3 IMPLANT
BNDG ESMARK 4X9 LF (GAUZE/BANDAGES/DRESSINGS) ×2 IMPLANT
BNDG GAUZE ELAST 4 BULKY (GAUZE/BANDAGES/DRESSINGS) ×3 IMPLANT
BRUSH SCRUB EZ PLAIN DRY (MISCELLANEOUS) ×1 IMPLANT
CANISTER SUCT 1200ML W/VALVE (MISCELLANEOUS) ×1 IMPLANT
CORDS BIPOLAR (ELECTRODE) ×3 IMPLANT
COVER MAYO STAND STRL (DRAPES) ×3 IMPLANT
COVER TABLE BACK 60X90 (DRAPES) ×3 IMPLANT
DECANTER SPIKE VIAL GLASS SM (MISCELLANEOUS) IMPLANT
DRAIN JACKSON RD 7FR 3/32 (WOUND CARE) IMPLANT
DRAPE EXTREMITY T 121X128X90 (DRAPE) ×3 IMPLANT
DRAPE SURG 17X23 STRL (DRAPES) ×3 IMPLANT
DRSG EMULSION OIL 3X3 NADH (GAUZE/BANDAGES/DRESSINGS) ×3 IMPLANT
GAUZE SPONGE 4X4 16PLY XRAY LF (GAUZE/BANDAGES/DRESSINGS) IMPLANT
GAUZE XEROFORM 1X8 LF (GAUZE/BANDAGES/DRESSINGS) ×2 IMPLANT
GLOVE BIO SURGEON STRL SZ8 (GLOVE) ×3 IMPLANT
GLOVE BIOGEL M STRL SZ7.5 (GLOVE) ×2 IMPLANT
GLOVE BIOGEL PI IND STRL 6.5 (GLOVE) IMPLANT
GLOVE BIOGEL PI INDICATOR 6.5 (GLOVE) ×2
GLOVE ECLIPSE 6.5 STRL STRAW (GLOVE) ×2 IMPLANT
GLOVE SS BIOGEL STRL SZ 8 (GLOVE) ×1 IMPLANT
GLOVE SUPERSENSE BIOGEL SZ 8 (GLOVE)
GOWN STRL REUS W/ TWL LRG LVL3 (GOWN DISPOSABLE) ×1 IMPLANT
GOWN STRL REUS W/ TWL XL LVL3 (GOWN DISPOSABLE) ×1 IMPLANT
GOWN STRL REUS W/TWL LRG LVL3 (GOWN DISPOSABLE) ×6
GOWN STRL REUS W/TWL XL LVL3 (GOWN DISPOSABLE)
NDL HYPO 25X1 1.5 SAFETY (NEEDLE) ×1 IMPLANT
NEEDLE HYPO 22GX1.5 SAFETY (NEEDLE) IMPLANT
NEEDLE HYPO 25X1 1.5 SAFETY (NEEDLE) ×3 IMPLANT
NS IRRIG 1000ML POUR BTL (IV SOLUTION) ×3 IMPLANT
PACK BASIN DAY SURGERY FS (CUSTOM PROCEDURE TRAY) ×3 IMPLANT
PAD CAST 3X4 CTTN HI CHSV (CAST SUPPLIES) ×2 IMPLANT
PADDING CAST ABS 3INX4YD NS (CAST SUPPLIES) ×2
PADDING CAST ABS 4INX4YD NS (CAST SUPPLIES) ×2
PADDING CAST ABS COTTON 3X4 (CAST SUPPLIES) ×1 IMPLANT
PADDING CAST ABS COTTON 4X4 ST (CAST SUPPLIES) ×1 IMPLANT
PADDING CAST COTTON 3X4 STRL (CAST SUPPLIES) ×6
PASSER SUT SWANSON 36MM LOOP (INSTRUMENTS) IMPLANT
SHEET MEDIUM DRAPE 40X70 STRL (DRAPES) ×2 IMPLANT
SPLINT FIBERGLASS 3X35 (CAST SUPPLIES) ×2 IMPLANT
SPLINT PLASTER CAST XFAST 3X15 (CAST SUPPLIES) IMPLANT
SPLINT PLASTER XTRA FASTSET 3X (CAST SUPPLIES)
STOCKINETTE 4X48 STRL (DRAPES) ×3 IMPLANT
STOCKINETTE SYNTHETIC 3 UNSTER (CAST SUPPLIES) ×3 IMPLANT
SUCTION FRAZIER TIP 10 FR DISP (SUCTIONS) ×1 IMPLANT
SUT 5-0 PROLENE 8556 ×2 IMPLANT
SUT BONE WAX W31G (SUTURE) IMPLANT
SUT ETHILON 5 0 PS 2 18 (SUTURE) ×8 IMPLANT
SUT FIBERWIRE 3-0 18 TAPR NDL (SUTURE)
SUT FIBERWIRE 4-0 18 TAPR NDL (SUTURE) ×6
SUT PROLENE 4 0 PS 2 18 (SUTURE) ×3 IMPLANT
SUT VIC AB 4-0 P-3 18XBRD (SUTURE) IMPLANT
SUT VIC AB 4-0 P3 18 (SUTURE)
SUT VIC AB 5-0 P-3 18X BRD (SUTURE) IMPLANT
SUT VIC AB 5-0 P3 18 (SUTURE) ×3
SUTURE FIBERWR 3-0 18 TAPR NDL (SUTURE) IMPLANT
SUTURE FIBERWR 4-0 18 TAPR NDL (SUTURE) ×2 IMPLANT
SYR BULB 3OZ (MISCELLANEOUS) ×3 IMPLANT
SYR CONTROL 10ML LL (SYRINGE) ×4 IMPLANT
TAPE SURG TRANSPORE 1 IN (GAUZE/BANDAGES/DRESSINGS) ×1 IMPLANT
TAPE SURGICAL TRANSPORE 1 IN (GAUZE/BANDAGES/DRESSINGS)
TOWEL OR 17X24 6PK STRL BLUE (TOWEL DISPOSABLE) ×4 IMPLANT
TUBE CONNECTING 20'X1/4 (TUBING)
TUBE CONNECTING 20X1/4 (TUBING) ×1 IMPLANT
UNDERPAD 30X30 INCONTINENT (UNDERPADS AND DIAPERS) ×3 IMPLANT

## 2013-12-29 NOTE — Transfer of Care (Signed)
Immediate Anesthesia Transfer of Care Note  Patient: Patricia Sanchez  Procedure(s) Performed: Procedure(s): FLEXOR TENDON REPAIR  RIGHT RING FINGER AND RIGHT SMALL FINGER (Right)  Patient Location: PACU  Anesthesia Type:General  Level of Consciousness: awake and sedated  Airway & Oxygen Therapy: Patient Spontanous Breathing and Patient connected to face mask oxygen  Post-op Assessment: Report given to PACU RN  Post vital signs: Reviewed and stable  Complications: No apparent anesthesia complications

## 2013-12-29 NOTE — Anesthesia Preprocedure Evaluation (Addendum)
Anesthesia Evaluation  Patient identified by MRN, date of birth, ID band Patient awake    Reviewed: Allergy & Precautions, H&P , NPO status , Patient's Chart, lab work & pertinent test results  History of Anesthesia Complications (+) PONV  Airway Mallampati: I TM Distance: >3 FB Neck ROM: Full    Dental  (+) Teeth Intact, Dental Advisory Given   Pulmonary Current Smoker,  breath sounds clear to auscultation        Cardiovascular Rhythm:Regular Rate:Normal     Neuro/Psych    GI/Hepatic   Endo/Other    Renal/GU      Musculoskeletal   Abdominal   Peds  Hematology   Anesthesia Other Findings   Reproductive/Obstetrics                           Anesthesia Physical Anesthesia Plan  ASA: II  Anesthesia Plan: General   Post-op Pain Management: MAC Combined w/ Regional for Post-op pain   Induction: Intravenous  Airway Management Planned: LMA  Additional Equipment:   Intra-op Plan:   Post-operative Plan: Extubation in OR  Informed Consent: I have reviewed the patients History and Physical, chart, labs and discussed the procedure including the risks, benefits and alternatives for the proposed anesthesia with the patient or authorized representative who has indicated his/her understanding and acceptance.   Dental advisory given  Plan Discussed with: CRNA, Anesthesiologist and Surgeon  Anesthesia Plan Comments: (Pt is deaf.  Deaf interpreter will accompany pt to OR and remain for help in PACU.)       Anesthesia Quick Evaluation

## 2013-12-29 NOTE — Anesthesia Procedure Notes (Signed)
Procedure Name: LMA Insertion Date/Time: 12/29/2013 12:08 PM Performed by: Genevieve NorlanderLINKA, Payzlee Ryder L Pre-anesthesia Checklist: Patient identified, Emergency Drugs available, Suction available, Patient being monitored and Timeout performed Patient Re-evaluated:Patient Re-evaluated prior to inductionOxygen Delivery Method: Circle System Utilized Preoxygenation: Pre-oxygenation with 100% oxygen Intubation Type: IV induction Ventilation: Mask ventilation without difficulty LMA: LMA inserted LMA Size: 3.0 Number of attempts: 1 Airway Equipment and Method: bite block Placement Confirmation: positive ETCO2 and breath sounds checked- equal and bilateral Tube secured with: Tape Dental Injury: Teeth and Oropharynx as per pre-operative assessment

## 2013-12-29 NOTE — H&P (Signed)
Reason for Consult:H&P for surgery Referring Physician: ER  CC:I cut my fingers with a knife  HPI:  Patricia Sanchez is an 23 y.o. right handed female who presents with laceration and inability to move fingers. Pain is rated at  7  /10 and is described as sharp.  Pain is constant/intermittant.  Pain is made better by rest/immobilization, worse with motion.   Associated signs/symptoms:  Some altered sensation Previous treatment:  none  Past Medical History  Diagnosis Date  . Postoperative nausea and vomiting   . Deaf   . Injury of flexor tendon of hand 12/2013    right ring and small fingers    Past Surgical History  Procedure Laterality Date  . Cholecystectomy  11/01/2011    Procedure: LAPAROSCOPIC CHOLECYSTECTOMY;  Surgeon: Atilano InaEric M Wilson, MD;  Location: [facility];  Service: General;  Laterality: N/A;  with intraoperative cholangiogram  . Tonsillectomy and adenoidectomy  2001  . Wisdom tooth extraction  03/26/2011  . Bunionectomy Left     Family History  Problem Relation Age of Onset  . Hypertension Mother     Social History:  reports that she has been smoking Cigarettes.  She has been smoking about 0.00 packs per day for the past 5 years. She has never used smokeless tobacco. She reports that she does not drink alcohol or use illicit drugs.  Allergies: No Known Allergies  Medications: I have reviewed the patient's current medications.  No results found for this or any previous visit (from the past 48 hour(s)).  No results found.  Pertinent items are noted in HPI.   General appearance: alert and cooperative Resp: clear to auscultation bilaterally Cardio: regular rate and rhythm GI: soft, non-tender; bowel sounds normal; no masses,  no organomegaly Extremities: extremities normal, atraumatic, no cyanosis or edema Except for RRF, RSF with volar lacerations (closed) and inability to flex fingers, difficult to get an accurate sensory exam, but pt relates altered sensation to  sf, ?rf   Assessment: Complex lacerations of RRF, RSF, probable tendon lacerations , ? Digital nerve lacerations Plan: OR for exploration and repair of above mentioned structures. I have discussed this treatment plan in detail with patient and/or family, including the risks of the recommended treatment or surgery, the benefits and the alternatives.  The patient and/or caregiver understands that additional treatment may be necessary.  Patricia Sanchez CHRISTOPHER 12/29/2013, 9:29 AM

## 2013-12-29 NOTE — Anesthesia Postprocedure Evaluation (Signed)
  Anesthesia Post-op Note  Patient: Patricia Sanchez-Draft  Procedure(s) Performed: Procedure(s): FLEXOR TENDON REPAIR  RIGHT RING FINGER AND RIGHT SMALL FINGER (Right)  Patient Location: PACU  Anesthesia Type:General  Level of Consciousness: awake and alert   Airway and Oxygen Therapy: Patient Spontanous Breathing  Post-op Pain: mild  Post-op Assessment: Post-op Vital signs reviewed  Post-op Vital Signs: stable  Complications: No apparent anesthesia complications

## 2013-12-29 NOTE — Discharge Instructions (Signed)
Keep dressing CLEAN/DRY/INTACT until follow up appt.    Post Anesthesia Home Care Instructions  Activity: Get plenty of rest for the remainder of the day. A responsible adult should stay with you for 24 hours following the procedure.  For the next 24 hours, DO NOT: -Drive a car -Advertising copywriterperate machinery -Drink alcoholic beverages -Take any medication unless instructed by your physician -Make any legal decisions or sign important papers.  Meals: Start with liquid foods such as gelatin or soup. Progress to regular foods as tolerated. Avoid greasy, spicy, heavy foods. If nausea and/or vomiting occur, drink only clear liquids until the nausea and/or vomiting subsides. Call your physician if vomiting continues.  Special Instructions/Symptoms: Your throat may feel dry or sore from the anesthesia or the breathing tube placed in your throat during surgery. If this causes discomfort, gargle with warm salt water. The discomfort should disappear within 24 hours.   Call your surgeon if you experience:   1.  Fever over 101.0. 2.  Inability to urinate. 3.  Nausea and/or vomiting. 4.  Extreme swelling or bruising at the surgical site. 5.  Continued bleeding from the incision. 6.  Increased pain, redness or drainage from the incision. 7.  Problems related to your pain medication.

## 2013-12-30 NOTE — Op Note (Signed)
NAME:  Patricia Sanchez, Patricia Sanchez NO.:  [id number]  MEDICAL RECORD NO.:  [id number]  LOCATION:                               FACILITY:  [facility]  PHYSICIAN:  Johnette Abraham, MD    DATE OF BIRTH:  02-09-1991  DATE OF PROCEDURE:  12/29/2013 DATE OF DISCHARGE:  12/29/2013                              OPERATIVE REPORT   PREOPERATIVE DIAGNOSIS:  Complex laceration to the right ring finger and right small finger with presumed flexor tendon injury and possible digital nerve injury.  POSTOPERATIVE DIAGNOSIS:  Complex laceration to the right ring finger and right small finger with presumed flexor tendon injury and possible digital nerve injury.  PROCEDURES:  Exploration of wounds of the right small and right ring fingers.  Repair of both the FDS and the FDP tendons to the small and the ring fingers, reconstruction of pulleys of the A2 and A4 pulley of the right small and right ring fingers, and complex closure of right small finger and right ring finger.  ANESTHESIA:  General.  INDICATIONS:  Patricia Sanchez is a 23 year old female, who lacerated these 2 fingers a couple of days ago while cooking.  She presented to the emergency room and then to my office with inability to flex the fingers and altered sensation.  Risks, benefits, and alternatives of her diagnosis were discussed with her and treatment options including surgery.  All the risks of surgery and extensive rehab were discussed with her.  She agreed with these and decided to proceed with the treatment plan.  Consent was obtained.  PROCEDURE IN DETAIL:  The patient was taken the operating room, placed supine on the operating table.  Generalized anesthesia was administered without difficulty.  The right upper extremity was prepped and draped in normal sterile fashion.  Began with the right small finger, the previous horizontal laceration over the proximal phalanx was opened and it was evident that there was a laceration  down through the tendon sheath with obvious tendon injury.  The incision was lengthened significantly all the way up to the distal phalanx and proximally overlying the metacarpal head to expose the tendon ends.  The FDS and FDP were both transected completely.  The A2 and A4 pulleys had to be stepwise cut to adequately expose the tendon ends.  The proximal end of the tendons were brought into the wound underneath the cruciate pulley and with 4-0 FiberWire suture.  Both the FDS, both slips, and the FDP tendons were nicely approximated.  The FDP tendon was reinforced with a running epitendinous 5-0 Prolene suture.  Afterwards the small slips of the A2 and A4 pulley were reconstructed with 4-0 Vicryl suture.  Gentle manipulation of the finger did not reveal any gaping of the tendon structures.  While repairing the tendon both the radial and ulnar neurovascular bundles were visualized and felt to be contused, but intact without laceration or significant injury requiring repair.  Following the wound exposing the structures on the volar aspect of the finger were brought in approximation and the skin gently rearranged to allowed non-tension closure and this was performed with multiple 5-0 interrupted nylon sutures.  Afterwards the ring finger was addressed.  Again, the horizontal laceration over the  middle phalanx was exposed.  It was obvious that the flexor tendons were lacerated.  Neurovascular bundles were evaluated and felt to be in continuity without significant injury and the incision had to be lengthened significantly to expose both the tendon ends and again the tendon was grasped.  The A2 and A4 pulley had to be stepwise released in order to grasp the tendons and pulled into close approximation.  Each FDS and FDP was repaired with 4-0 FiberWire. The FDP was repaired again with a running epitendinous 5-0 Prolene for additional strength.  The A2 and A4 pulleys slips were  then reapproximated.  Afterwards the skin again was closed and rearranged to allow tension-free closure.  Afterwards sterile dressing and a dorsal blocking splint were placed.  The patient tolerated procedure well.  Of note, [facility] than in the main OR.     Johnette AbrahamHarrill C Denise Bramblett, MD     HCC/MEDQ  D:  12/29/2013  T:  12/30/2013  Job:  [id number]

## 2013-12-31 ENCOUNTER — Ambulatory Visit (INDEPENDENT_AMBULATORY_CARE_PROVIDER_SITE_OTHER): Payer: Medicare Other

## 2013-12-31 VITALS — BP 100/70 | HR 86 | Resp 16

## 2013-12-31 DIAGNOSIS — Z9889 Other specified postprocedural states: Secondary | ICD-10-CM

## 2013-12-31 DIAGNOSIS — M2010 Hallux valgus (acquired), unspecified foot: Secondary | ICD-10-CM

## 2013-12-31 DIAGNOSIS — M2040 Other hammer toe(s) (acquired), unspecified foot: Secondary | ICD-10-CM

## 2013-12-31 NOTE — Patient Instructions (Signed)
ICE INSTRUCTIONS  Apply ice or cold pack to the affected area at least 3 times a day for 10-15 minutes each time.  You should also use ice after prolonged activity or vigorous exercise.  Do not apply ice longer than 20 minutes at one time.  Always keep a cloth between your skin and the ice pack to prevent burns.  Being consistent and following these instructions will help control your symptoms.  We suggest you purchase a gel ice pack because they are reusable and do bit leak.  Some of them are designed to wrap around the area.  Use the method that works best for you.  Here are some other suggestions for icing.   Use a frozen bag of peas or corn-inexpensive and molds well to your body, usually stays frozen for 10 to 20 minutes.  Wet a towel with cold water and squeeze out the excess until it's damp.  Place in a bag in the freezer for 20 minutes. Then remove and use.  Continue with active and passive range of motion exercises great toe: Move toe up and down proxy 100 times a day and divided sessions at least 3 times a day to help stretch and improved flexibility postoperatively
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
## 2014-01-03 NOTE — Addendum Note (Signed)
Addendum created 01/03/14 0756 by Sharee Holstererry Zenda Herskowitz, MD   Modules edited: Anesthesia Responsible Staff

## 2014-01-13 ENCOUNTER — Ambulatory Visit: Payer: Medicare Other | Attending: General Surgery | Admitting: Occupational Therapy

## 2014-01-13 DIAGNOSIS — M25549 Pain in joints of unspecified hand: Secondary | ICD-10-CM | POA: Insufficient documentation

## 2014-01-13 DIAGNOSIS — R279 Unspecified lack of coordination: Secondary | ICD-10-CM | POA: Diagnosis not present

## 2014-01-13 DIAGNOSIS — IMO0001 Reserved for inherently not codable concepts without codable children: Secondary | ICD-10-CM | POA: Diagnosis not present

## 2014-01-13 DIAGNOSIS — M6281 Muscle weakness (generalized): Secondary | ICD-10-CM | POA: Insufficient documentation

## 2014-01-19 ENCOUNTER — Telehealth: Payer: Self-pay | Admitting: *Deleted

## 2014-01-19 ENCOUNTER — Ambulatory Visit: Payer: Medicare Other | Admitting: Occupational Therapy

## 2014-01-19 NOTE — Telephone Encounter (Signed)
Advised patient that we cannot call in a pain medication. Patient needs to pick up a prescription from the office. Therefore she will need to be seen and evaluated. These are the new Federal loss for narcotic pain meds. No longer can pain meds be called in or fax in to pharmacy or e-prescribed. She needs to pick up and be given a prescription at the office  Patricia Sanchez DPM

## 2014-01-19 NOTE — Telephone Encounter (Signed)
Patient called and requested a refill on her pain medication.  She stated she hit her foot.  I informed her she needed to schedule an appointment to have it checked.  She stated she didn't need to do all that, just needs a refill.  I informed her we need to make sure it's okay.  She was very adamant about not coming in for an appointment.  I told her I would ask Dr. Ralene CorkSikora and give her a call back this afternoon.  She had surgery on 11/22/13 Bunionectomy/ Hammer Toe 2nd Left.

## 2014-01-19 NOTE — Telephone Encounter (Signed)
I called and gave her Dr. Dionne BucySikora's response that she she needs to be seen and evaluated.  I informed her of the changes in Federal Law that narcotics can no longer be called into the pharmacy.  I sent her to a scheduler for an appointment today.  According to the scheduler patient refused the appointment, she said this happened about 3 weeks ago.  It's feeling better.

## 2014-01-25 ENCOUNTER — Ambulatory Visit: Payer: Medicare Other | Attending: General Surgery | Admitting: Occupational Therapy

## 2014-01-25 DIAGNOSIS — M6281 Muscle weakness (generalized): Secondary | ICD-10-CM | POA: Insufficient documentation

## 2014-01-25 DIAGNOSIS — IMO0001 Reserved for inherently not codable concepts without codable children: Secondary | ICD-10-CM | POA: Insufficient documentation

## 2014-01-25 DIAGNOSIS — R279 Unspecified lack of coordination: Secondary | ICD-10-CM | POA: Insufficient documentation

## 2014-01-25 DIAGNOSIS — M25549 Pain in joints of unspecified hand: Secondary | ICD-10-CM | POA: Insufficient documentation

## 2014-01-27 ENCOUNTER — Ambulatory Visit: Payer: Medicare Other | Admitting: Occupational Therapy

## 2014-01-31 ENCOUNTER — Ambulatory Visit: Payer: Medicare Other | Admitting: Occupational Therapy

## 2014-02-02 ENCOUNTER — Ambulatory Visit: Payer: Medicare Other | Admitting: Occupational Therapy

## 2014-02-07 ENCOUNTER — Encounter: Payer: Medicaid Other | Admitting: Occupational Therapy

## 2014-02-09 ENCOUNTER — Encounter: Payer: Medicaid Other | Admitting: Occupational Therapy

## 2014-02-14 ENCOUNTER — Encounter: Payer: Medicaid Other | Admitting: Occupational Therapy

## 2014-02-16 ENCOUNTER — Ambulatory Visit: Payer: Medicare Other | Admitting: Occupational Therapy

## 2014-02-21 ENCOUNTER — Ambulatory Visit: Payer: Medicare Other | Admitting: Occupational Therapy

## 2014-02-23 ENCOUNTER — Encounter: Payer: Medicaid Other | Admitting: Occupational Therapy

## 2014-02-28 ENCOUNTER — Ambulatory Visit: Payer: Medicare Other | Attending: General Surgery | Admitting: Occupational Therapy

## 2014-02-28 DIAGNOSIS — M25549 Pain in joints of unspecified hand: Secondary | ICD-10-CM | POA: Diagnosis not present

## 2014-02-28 DIAGNOSIS — R279 Unspecified lack of coordination: Secondary | ICD-10-CM | POA: Insufficient documentation

## 2014-02-28 DIAGNOSIS — IMO0001 Reserved for inherently not codable concepts without codable children: Secondary | ICD-10-CM | POA: Diagnosis not present

## 2014-02-28 DIAGNOSIS — M6281 Muscle weakness (generalized): Secondary | ICD-10-CM | POA: Diagnosis not present

## 2014-03-02 ENCOUNTER — Encounter: Payer: Medicaid Other | Admitting: Occupational Therapy

## 2014-06-13 ENCOUNTER — Emergency Department (HOSPITAL_COMMUNITY)
Admission: EM | Admit: 2014-06-13 | Discharge: 2014-06-13 | Disposition: A | Discharge status: Home / Self Care | Payer: Medicare Other | Attending: Emergency Medicine | Admitting: Emergency Medicine

## 2014-06-13 ENCOUNTER — Encounter (HOSPITAL_COMMUNITY): Payer: Self-pay | Admitting: Emergency Medicine

## 2014-06-13 DIAGNOSIS — F17200 Nicotine dependence, unspecified, uncomplicated: Secondary | ICD-10-CM | POA: Insufficient documentation

## 2014-06-13 DIAGNOSIS — Z87828 Personal history of other (healed) physical injury and trauma: Secondary | ICD-10-CM | POA: Diagnosis not present

## 2014-06-13 DIAGNOSIS — R1012 Left upper quadrant pain: Secondary | ICD-10-CM | POA: Diagnosis not present

## 2014-06-13 DIAGNOSIS — R112 Nausea with vomiting, unspecified: Secondary | ICD-10-CM | POA: Insufficient documentation

## 2014-06-13 DIAGNOSIS — Z9089 Acquired absence of other organs: Secondary | ICD-10-CM | POA: Diagnosis not present

## 2014-06-13 DIAGNOSIS — R110 Nausea: Secondary | ICD-10-CM

## 2014-06-13 DIAGNOSIS — H9190 Unspecified hearing loss, unspecified ear: Secondary | ICD-10-CM | POA: Insufficient documentation

## 2014-06-13 DIAGNOSIS — Z3202 Encounter for pregnancy test, result negative: Secondary | ICD-10-CM | POA: Insufficient documentation

## 2014-06-13 DIAGNOSIS — R197 Diarrhea, unspecified: Secondary | ICD-10-CM | POA: Insufficient documentation

## 2014-06-13 LAB — COMPREHENSIVE METABOLIC PANEL
ALK PHOS: 45 U/L (ref 39–117)
ALT: 17 U/L (ref 0–35)
AST: 21 U/L (ref 0–37)
Albumin: 4.6 g/dL (ref 3.5–5.2)
Anion gap: 14 (ref 5–15)
BUN: 7 mg/dL (ref 6–23)
CALCIUM: 9.3 mg/dL (ref 8.4–10.5)
CO2: 22 meq/L (ref 19–32)
Chloride: 103 mEq/L (ref 96–112)
Creatinine, Ser: 1.19 mg/dL — ABNORMAL HIGH (ref 0.50–1.10)
GFR calc Af Amer: 73 mL/min — ABNORMAL LOW (ref >90)
GFR, EST NON AFRICAN AMERICAN: 63 mL/min — AB (ref >90)
Glucose, Bld: 99 mg/dL (ref 70–99)
POTASSIUM: 4.2 meq/L (ref 3.7–5.3)
SODIUM: 139 meq/L (ref 137–147)
Total Bilirubin: 0.7 mg/dL (ref 0.3–1.2)
Total Protein: 7.8 g/dL (ref 6.0–8.3)

## 2014-06-13 LAB — URINALYSIS, ROUTINE W REFLEX MICROSCOPIC
Bilirubin Urine: NEGATIVE
GLUCOSE, UA: NEGATIVE mg/dL
Ketones, ur: 15 mg/dL — AB
Leukocytes, UA: NEGATIVE
Nitrite: NEGATIVE
PROTEIN: NEGATIVE mg/dL
Specific Gravity, Urine: 1.019 (ref 1.005–1.030)
Urobilinogen, UA: 1 mg/dL (ref 0.0–1.0)
pH: 7.5 (ref 5.0–8.0)

## 2014-06-13 LAB — CBC WITH DIFFERENTIAL/PLATELET
BASOS ABS: 0 10*3/uL (ref 0.0–0.1)
Basophils Relative: 0 % (ref 0–1)
Eosinophils Absolute: 0.1 10*3/uL (ref 0.0–0.7)
Eosinophils Relative: 2 % (ref 0–5)
HCT: 40.9 % (ref 36.0–46.0)
Hemoglobin: 13.6 g/dL (ref 12.0–15.0)
Lymphocytes Relative: 26 % (ref 12–46)
Lymphs Abs: 1.3 10*3/uL (ref 0.7–4.0)
MCH: 30.4 pg (ref 26.0–34.0)
MCHC: 33.3 g/dL (ref 30.0–36.0)
MCV: 91.3 fL (ref 78.0–100.0)
Monocytes Absolute: 0.3 10*3/uL (ref 0.1–1.0)
Monocytes Relative: 6 % (ref 3–12)
NEUTROS ABS: 3.4 10*3/uL (ref 1.7–7.7)
NEUTROS PCT: 66 % (ref 43–77)
Platelets: 218 10*3/uL (ref 150–400)
RBC: 4.48 MIL/uL (ref 3.87–5.11)
RDW: 13.3 % (ref 11.5–15.5)
WBC: 5.1 10*3/uL (ref 4.0–10.5)

## 2014-06-13 LAB — LIPASE, BLOOD: LIPASE: 35 U/L (ref 11–59)

## 2014-06-13 LAB — URINE MICROSCOPIC-ADD ON

## 2014-06-13 LAB — POC URINE PREG, ED: Preg Test, Ur: NEGATIVE

## 2014-06-13 MED ORDER — PROCHLORPERAZINE EDISYLATE 5 MG/ML IJ SOLN
10.0000 mg | Freq: Once | INTRAMUSCULAR | Status: AC
  Administered 2014-06-13: 10 mg via INTRAVENOUS
  Filled 2014-06-13: qty 2

## 2014-06-13 MED ORDER — PROCHLORPERAZINE MALEATE 10 MG PO TABS: 10.0000 mg | ORAL_TABLET | Freq: Three times a day (TID) | ORAL | Status: DC | PRN

## 2014-06-13 MED ORDER — SODIUM CHLORIDE 0.9 % IV BOLUS (SEPSIS)
500.0000 mL | Freq: Once | INTRAVENOUS | Status: AC
  Administered 2014-06-13: 500 mL via INTRAVENOUS

## 2014-06-13 NOTE — ED Notes (Signed)
Patricia Sanchez in main lab states she will add lipase on .

## 2014-06-13 NOTE — ED Notes (Signed)
Pt is deaf, c/o not eating in 2 days, nausea, vomiting x 2. Denies pain. Thinks her iron is low. Reports chills.

## 2014-06-13 NOTE — ED Notes (Signed)
PAGED FOR DEAF INTERPRETER

## 2014-06-13 NOTE — ED Provider Notes (Signed)
CSN: [id number]     Arrival date & time 06/13/14  1309 History   First MD Initiated Contact with Patient 06/13/14 [phone]     Chief Complaint  Patient presents with  . Nausea     (Consider location/radiation/quality/duration/timing/severity/associated sxs/prior Treatment) The history is provided by the patient. A language interpreter was used (American sign language interpreter, at the bedside).   Patricia Sanchez is a 23 y.o. female who presents for evaluation of recurrent upper abdominal discomfort, associated with nausea, vomiting, and diarrhea. Symptoms have been present for 4 years. She has been treated with various medications that she cannot recall, she is never seen a specialist for it. She has had her gallbladder removed. She denies known sick contacts. She denies fever, cough, shortness of breath, chest pain, paresthesias, or focal weakness. She occasionally has a sensation of generalized weakness. She is not take any regular medications. She has not seen her PCP for this recently. There are no other known modifying factors.   Past Medical History  Diagnosis Date  . Postoperative nausea and vomiting   . Deaf   . Injury of flexor tendon of hand 12/2013    right ring and small fingers   Past Surgical History  Procedure Laterality Date  . Cholecystectomy  11/01/2011    Procedure: LAPAROSCOPIC CHOLECYSTECTOMY;  Surgeon: Atilano Ina, MD;  Location: [facility];  Service: General;  Laterality: N/A;  with intraoperative cholangiogram  . Tonsillectomy and adenoidectomy  2001  . Wisdom tooth extraction  03/26/2011  . Bunionectomy Left   . Hand surgery    . Tendon repair Right 12/29/2013    Procedure: FLEXOR TENDON REPAIR  RIGHT RING FINGER AND RIGHT SMALL FINGER;  Surgeon: Knute Neu, MD;  Location: [facility];  Service: Plastics;  Laterality: Right;   Family History  Problem Relation Age of Onset  . Hypertension Mother    History  Substance Use Topics  . Smoking  status: Current Every Day Smoker -- 5 years    Types: Cigarettes  . Smokeless tobacco: Never Used     Comment: 2-3 cig./day  . Alcohol Use: Yes   OB History   Grav Para Term Preterm Abortions TAB SAB Ect Mult Living                 Review of Systems  All other systems reviewed and are negative.     Allergies  Review of patient's allergies indicates no known allergies.  Home Medications   Prior to Admission medications   Medication Sig Start Date End Date Taking? Authorizing Provider  Multiple Vitamin (MULTIVITAMIN) tablet Take 1 tablet by mouth daily.   Yes Historical Provider, MD  prochlorperazine (COMPAZINE) 10 MG tablet Take 1 tablet (10 mg total) by mouth every 8 (eight) hours as needed for nausea or vomiting. 06/13/14   Flint Melter, MD   BP 141/80  Pulse 89  Temp(Src) 97.7 F (36.5 C) (Oral)  Resp 22  SpO2 95%  LMP 06/12/2014 Physical Exam  Nursing note and vitals reviewed. Constitutional: She is oriented to person, place, and time. She appears well-developed and well-nourished. No distress.  HENT:  Head: Normocephalic and atraumatic.  Eyes: Conjunctivae and EOM are normal. Pupils are equal, round, and reactive to light.  Neck: Normal range of motion and phonation normal. Neck supple.  Cardiovascular: Normal rate, regular rhythm and intact distal pulses.   Pulmonary/Chest: Effort normal and breath sounds normal. She exhibits no tenderness.  Abdominal: Soft. She exhibits no  distension and no mass. There is tenderness (left upper quadrant, mild). There is no rebound and no guarding.  Musculoskeletal: Normal range of motion.  Neurological: She is alert and oriented to person, place, and time. She exhibits normal muscle tone.  Skin: Skin is warm and dry.  Psychiatric: She has a normal mood and affect. Her behavior is normal. Judgment and thought content normal.    ED Course  Procedures (including critical care time) Medications  prochlorperazine (COMPAZINE)  injection 10 mg (10 mg Intravenous Given 06/13/14 1531)  sodium chloride 0.9 % bolus 500 mL (0 mLs Intravenous Stopped 06/13/14 1631)    Patient Vitals for the past 24 hrs:  BP Temp Temp src Pulse Resp SpO2  06/13/14 1719 141/80 mmHg - - 89 22 95 %  06/13/14 1630 120/61 mmHg - - 64 15 100 %  06/13/14 1624 141/76 mmHg - - - 20 94 %  06/13/14 1615 - - - 57 18 100 %  06/13/14 1600 - - - 61 16 100 %  06/13/14 1530 159/98 mmHg - - 60 12 100 %  06/13/14 1500 151/88 mmHg - - 59 17 100 %  06/13/14 1455 147/97 mmHg - - - 22 100 %  06/13/14 1445 143/81 mmHg - - 54 15 100 %  06/13/14 1321 136/81 mmHg 97.7 F (36.5 C) Oral 58 15 97 %    At discharge -Reevaluation with update and discussion. After initial assessment and treatment, an updated evaluation reveals . She feels better after treatment; findings discussed with patient, all questions answered. Fillmore Shellhammer L   Labs Review Labs Reviewed  COMPREHENSIVE METABOLIC PANEL - Abnormal; Notable for the following:    Creatinine, Ser 1.19 (*)    GFR calc non Af Amer 63 (*)    GFR calc Af Amer 73 (*)    All other components within normal limits  URINALYSIS, ROUTINE W REFLEX MICROSCOPIC - Abnormal; Notable for the following:    APPearance CLOUDY (*)    Hgb urine dipstick TRACE (*)    Ketones, ur 15 (*)    All other components within normal limits  URINE MICROSCOPIC-ADD ON - Abnormal; Notable for the following:    Squamous Epithelial / LPF FEW (*)    Bacteria, UA FEW (*)    All other components within normal limits  CBC WITH DIFFERENTIAL  LIPASE, BLOOD  POC URINE PREG, ED    Imaging Review No results found.   EKG Interpretation None      MDM   Final diagnoses:  Nausea    Nonspecific, nausea, with the ED, evaluation. Symptoms are chronic. Doubt serious bacterial infection or metabolic instability.  Nursing Notes Reviewed/ Care Coordinated Applicable Imaging Reviewed Interpretation of Laboratory Data incorporated into ED  treatment  The patient appears reasonably screened and/or stabilized for discharge and I doubt any other medical condition or other [facility] requiring further screening, evaluation, or treatment in the ED at this time prior to discharge.  Plan: Home Medications- Phenergan tablets; Home Treatments- rest, gradually advance diet.; return here if the recommended treatment, does not improve the symptoms; Recommended follow up- PCP, and GI, as needed     Flint Melter, MD 06/14/14 [phone]

## 2014-06-13 NOTE — Discharge Instructions (Signed)

## 2014-08-05 ENCOUNTER — Other Ambulatory Visit: Payer: Self-pay

## 2014-08-31 ENCOUNTER — Emergency Department (HOSPITAL_COMMUNITY)
Admission: EM | Admit: 2014-08-31 | Discharge: 2014-08-31 | Disposition: A | Discharge status: Home / Self Care | Payer: No Typology Code available for payment source | Attending: Emergency Medicine | Admitting: Emergency Medicine

## 2014-08-31 ENCOUNTER — Encounter (HOSPITAL_COMMUNITY): Payer: Self-pay | Admitting: Emergency Medicine

## 2014-08-31 DIAGNOSIS — Y9389 Activity, other specified: Secondary | ICD-10-CM | POA: Insufficient documentation

## 2014-08-31 DIAGNOSIS — Y998 Other external cause status: Secondary | ICD-10-CM | POA: Insufficient documentation

## 2014-08-31 DIAGNOSIS — Z79899 Other long term (current) drug therapy: Secondary | ICD-10-CM | POA: Insufficient documentation

## 2014-08-31 DIAGNOSIS — S0990XA Unspecified injury of head, initial encounter: Secondary | ICD-10-CM | POA: Diagnosis not present

## 2014-08-31 DIAGNOSIS — Y92488 Other paved roadways as the place of occurrence of the external cause: Secondary | ICD-10-CM | POA: Insufficient documentation

## 2014-08-31 DIAGNOSIS — Z720 Tobacco use: Secondary | ICD-10-CM | POA: Diagnosis not present

## 2014-08-31 DIAGNOSIS — H913 Deaf nonspeaking, not elsewhere classified: Secondary | ICD-10-CM | POA: Diagnosis not present

## 2014-08-31 DIAGNOSIS — S3992XA Unspecified injury of lower back, initial encounter: Secondary | ICD-10-CM | POA: Diagnosis present

## 2014-08-31 DIAGNOSIS — M545 Low back pain, unspecified: Secondary | ICD-10-CM

## 2014-08-31 MED ORDER — CYCLOBENZAPRINE HCL 10 MG PO TABS: 10.0000 mg | ORAL_TABLET | Freq: Two times a day (BID) | ORAL | Status: DC | PRN

## 2014-08-31 MED ORDER — CYCLOBENZAPRINE HCL 10 MG PO TABS
5.0000 mg | ORAL_TABLET | Freq: Once | ORAL | Status: AC
  Administered 2014-08-31: 5 mg via ORAL
  Filled 2014-08-31: qty 1

## 2014-08-31 MED ORDER — TRAMADOL HCL 50 MG PO TABS: 50.0000 mg | ORAL_TABLET | Freq: Four times a day (QID) | ORAL | Status: DC | PRN

## 2014-08-31 MED ORDER — IBUPROFEN 600 MG PO TABS: 600.0000 mg | ORAL_TABLET | Freq: Four times a day (QID) | ORAL | Status: DC | PRN

## 2014-08-31 MED ORDER — IBUPROFEN 400 MG PO TABS
600.0000 mg | ORAL_TABLET | Freq: Once | ORAL | Status: AC
  Administered 2014-08-31: 600 mg via ORAL
  Filled 2014-08-31 (×2): qty 1
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
## 2014-08-31 NOTE — ED Notes (Signed)
Pt removed her c-collar.   
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
## 2014-08-31 NOTE — ED Notes (Signed)
Pt is DEAF. Family members at bedside are also deaf. Deaf translator also in room who is, apparently, a family friend.

## 2015-02-03 ENCOUNTER — Ambulatory Visit: Payer: Medicare Other | Attending: Internal Medicine | Admitting: Physical Therapy

## 2015-02-03 ENCOUNTER — Encounter: Payer: Self-pay | Admitting: Physical Therapy

## 2015-02-03 DIAGNOSIS — M5432 Sciatica, left side: Secondary | ICD-10-CM

## 2015-02-03 DIAGNOSIS — M2560 Stiffness of unspecified joint, not elsewhere classified: Secondary | ICD-10-CM | POA: Insufficient documentation

## 2015-02-03 NOTE — Patient Instructions (Signed)
Flexion avoidance, use lumbar roll for sitting (patient immediately reports a good improvement), trial of prone press-ups; Discussed "stoplight system" to avoid exacerbation/peripheralization
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
## 2015-02-07 ENCOUNTER — Encounter: Payer: Self-pay | Admitting: *Deleted

## 2015-02-08 ENCOUNTER — Ambulatory Visit: Payer: Medicare Other | Admitting: Neurology

## 2015-02-16 ENCOUNTER — Encounter: Payer: Self-pay | Admitting: Neurology

## 2015-02-20 ENCOUNTER — Ambulatory Visit: Payer: Medicare Other | Attending: Internal Medicine | Admitting: Physical Therapy

## 2015-02-20 DIAGNOSIS — M2560 Stiffness of unspecified joint, not elsewhere classified: Secondary | ICD-10-CM | POA: Diagnosis not present

## 2015-02-20 DIAGNOSIS — M5432 Sciatica, left side: Secondary | ICD-10-CM | POA: Diagnosis not present

## 2015-02-20 NOTE — Patient Instructions (Addendum)
Posture Tips DO: - stand tall and erect - keep chin tucked in - keep head and shoulders in alignment - check posture regularly in mirror or large window - pull head back against headrest in car seat;  Change your position often.  Sit with lumbar support. DON'T: - slouch or slump while watching TV or reading - sit, stand or lie in one position  for too long;  Sitting is especially hard on the spine so if you sit at a desk/use the computer, then stand up often!   Copyright  VHI. All rights reserved.  Posture - Standing   Good posture is important. Avoid slouching and forward head thrust. Maintain curve in low back and align ears over shoul- ders, hips over ankles.  Pull your belly button in toward your back bone.   Copyright  VHI. All rights reserved.  Posture - Sitting   Sit upright, head facing forward. Try using a roll to support lower back. Keep shoulders relaxed, and avoid rounded back. Keep hips level with knees. Avoid crossing legs for long periods.   Copyright  VHI. All rights reserved.    Sleeping on Back  Place pillow under knees. A pillow with cervical support and a roll around waist are also helpful. Copyright  VHI. All rights reserved.  Sleeping on Side Place pillow between knees. Use cervical support under neck and a roll around waist as needed. Copyright  VHI. All rights reserved.   Sleeping on Stomach   If this is the only desirable sleeping position, place pillow under lower legs, and under stomach or chest as needed.  Posture - Sitting   Sit upright, head facing forward. Try using a roll to support lower back. Keep shoulders relaxed, and avoid rounded back. Keep hips level with knees. Avoid crossing legs for long periods. Stand to Sit / Sit to Stand   To sit: Bend knees to lower self onto front edge of chair, then scoot back on seat. To stand: Reverse sequence by placing one foot forward, and scoot to front of seat. Use rocking motion to stand up.    Work Height and Reach  Ideal work height is no more than 2 to 4 inches below elbow level when standing, and at elbow level when sitting. Reaching should be limited to arm's length, with elbows slightly bent.  Bending  Bend at hips and knees, not back. Keep feet shoulder-width apart.    Posture - Standing   Good posture is important. Avoid slouching and forward head thrust. Maintain curve in low back and align ears over shoul- ders, hips over ankles.  Alternating Positions   Alternate tasks and change positions frequently to reduce fatigue and muscle tension. Take rest breaks. Computer Work   Position work to face forward. Use proper work and seat height. Keep shoulders back and down, wrists straight, and elbows at right angles. Use chair that provides full back support. Add footrest and lumbar roll as needed.  Getting Into / Out of Car  Lower self onto seat, scoot back, then bring in one leg at a time. Reverse sequence to get out.  Dressing  Lie on back to pull socks or slacks over feet, or sit and bend leg while keeping back straight.    Housework - Sink  Place one foot on ledge of cabinet under sink when standing at sink for prolonged periods.   Pushing / Pulling  Pushing is preferable to pulling. Keep back in proper alignment, and use leg muscles to   do the work.  Deep Squat   Squat and lift with both arms held against upper trunk. Tighten stomach muscles without holding breath. Use smooth movements to avoid jerking.  Avoid Twisting   Avoid twisting or bending back. Pivot around using foot movements, and bend at knees if needed when reaching for articles.  Carrying Luggage   Distribute weight evenly on both sides. Use a cart whenever possible. Do not twist trunk. Move body as a unit.   Lifting Principles .Maintain proper posture and head alignment. .Slide object as close as possible before lifting. .Move obstacles out of the way. .Test before lifting; ask for  help if too heavy. .Tighten stomach muscles without holding breath. .Use smooth movements; do not jerk. .Use legs to do the work, and pivot with feet. .Distribute the work load symmetrically and close to the center of trunk. .Push instead of pull whenever possible.   Ask For Help   Ask for help and delegate to others when possible. Coordinate your movements when lifting together, and maintain the low back curve.  Log Roll   Lying on back, bend left knee and place left arm across chest. Roll all in one movement to the right. Reverse to roll to the left. Always move as one unit. Housework - Sweeping  Use long-handled equipment to avoid stooping.   Housework - Wiping  Position yourself as close as possible to reach work surface. Avoid straining your back.  Laundry - Unloading Wash   To unload small items at bottom of washer, lift leg opposite to arm being used to reach.  Gardening - Raking  Move close to area to be raked. Use arm movements to do the work. Keep back straight and avoid twisting.     Cart  When reaching into cart with one arm, lift opposite leg to keep back straight.   Getting Into / Out of Bed  Lower self to lie down on one side by raising legs and lowering head at the same time. Use arms to assist moving without twisting. Bend both knees to roll onto back if desired. To sit up, start from lying on side, and use same move-ments in reverse. Housework - Vacuuming  Hold the vacuum with arm held at side. Step back and forth to move it, keeping head up. Avoid twisting.   Laundry - Armed forces training and education officerLoading Wash  Position laundry basket so that bending and twisting can be avoided.   Laundry - Unloading Dryer  Squat down to reach into clothes dryer or use a reacher.  Gardening - Weeding / Psychiatric nurselanting  Squat or Kneel. Knee pads may be helpful.                  Pick one or 2 activities that hurt you the most and try to change habits, using good posture.

## 2015-02-20 NOTE — Therapy (Signed)
[facility] Outpatient Rehabilitation [facility] [address] [place], [place], [address] Phone: [phone]   Fax:  [phone]  Physical Therapy Treatment  Patient Details  Name: Patricia Sanchez MRN: [id number] Date of Birth: [birth year] Referring Provider:  Reather Converse, PA-C  Encounter Date: 02/20/2015      PT End of Session - 02/20/15 1732    Visit Number 2   Number of Visits 16   Date for PT Re-Evaluation 03/31/15   PT Start Time 1633   PT Stop Time 1722   PT Time Calculation (min) 49 min   Equipment Utilized During Treatment Other (comment)  mobilization strap   Activity Tolerance Patient limited by pain;Patient tolerated treatment well      Past Medical History  Diagnosis Date  . Postoperative nausea and vomiting   . Deaf   . Injury of flexor tendon of hand 12/2013    right ring and small fingers    Past Surgical History  Procedure Laterality Date  . Cholecystectomy  11/01/2011    Procedure: LAPAROSCOPIC CHOLECYSTECTOMY;  Surgeon: Atilano Ina, MD;  Location: [facility];  Service: General;  Laterality: N/A;  with intraoperative cholangiogram  . Tonsillectomy and adenoidectomy  2001  . Wisdom tooth extraction  03/26/2011  . Bunionectomy Left   . Hand surgery    . Tendon repair Right 12/29/2013    Procedure: FLEXOR TENDON REPAIR  RIGHT RING FINGER AND RIGHT SMALL FINGER;  Surgeon: Knute Neu, MD;  Location:  [facility];  Service: Plastics;  Laterality: Right;    There were no vitals filed for this visit.  Visit Diagnosis:  Back pain with left-sided sciatica  Joint stiffness of spine      Subjective Assessment - 02/20/15 1630    Subjective No questions.  Prone press ups helped a little bit. No pain now.   Currently in Pain? No/denies   Pain Location Back   Pain Orientation Left;Lower   Pain Descriptors / Indicators --  wringing and twisting   Pain Frequency Intermittent   Aggravating Factors  rainey weather, end of  the day sleeping ,    Pain Relieving Factors stretches prone                          [facility] Adult PT Treatment/Exercise - 02/20/15 1704    Posture/Postural Control   Posture/Postural Control --  education and pracriced Estate manager/land agent for ADL'd   Lumbar Exercises: Stretches   Standing Extension 3 reps;10 seconds  various cues needed, improvement unclear with this,    Prone on Elbows Stretch --  very painful, so stopped   Prone on Elbows Stretch Limitations explained when pain is centralized she can expect more back pain.   Press Ups 1 rep  very painful so stopped.   Lumbar Exercises: Standing   Other Standing Lumbar Exercises belt extension with movement   helped decrease pain and movement.   Lumbar Exercises: Supine   Bridge --  1 rep   Other Supine Lumbar Exercises decompression hooklying tolerated 4 minutes only 7.5/10                 PT Education - 02/20/15 1724    Education provided Yes   Education Details back standing belt extension   Person(s) Educated Patient   Methods Explanation;Demonstration;Handout;Verbal cues   Comprehension Verbalized understanding          PT Short Term Goals - 02/20/15 1737    PT SHORT  TERM GOAL #1   Title "Independent with initial HEP   Time 4   Period Weeks   Status On-going   PT SHORT TERM GOAL #2   Title "Demonstrate understanding of proper sitting posture and be more conscious of position and posture throughout 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   Time 8   Period Weeks   Status New   PT LONG TERM GOAL #2   Title "Pt will be independent with advanced HEP needed for return to full ROM and strength for playing basketball and jogging.   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   Time 8   Period Weeks   Status New   PT LONG TERM GOAL #5   Title FOTO functional outcome score improved from 45% limit to 33% indicating improved function with less pain.   Time 8   Period Weeks   Status New               Plan - 02/20/15 1733    Clinical Impression Statement patient said prone press ups were helpful when in fact she could not tolerate them in clinic, she was referring to sidelying hip abduction, clamshells and sidelying hip/knee flexion.  Patient has been using unsafe ADL techniques  and was surprised she was doing so many things incorrectly.  pain increased during session but returned to baseline with sitting supported with pillow and towel roll.   PT Next Visit Plan review body mechanics, belt exercise for extension  see PT plan.    Consulted and Agree with Plan of Care Patient        Problem List Patient Active Problem List   Diagnosis Date Noted  . Deaf 11/01/2011    [facility] Surgicenter LtdARRIS,KAREN 02/20/2015, 5:39 PM  [facility] [address] [place], [place], [address] Phone: [phone]   Fax:  [phone]   Liz BeachKaren Harris, PTA 02/20/2015 5:39 PM Phone: [phone] Fax: [phone]

## 2015-02-22 ENCOUNTER — Ambulatory Visit: Payer: Medicare Other | Admitting: Physical Therapy

## 2015-02-22 DIAGNOSIS — M5432 Sciatica, left side: Secondary | ICD-10-CM

## 2015-02-22 DIAGNOSIS — M2560 Stiffness of unspecified joint, not elsewhere classified: Secondary | ICD-10-CM

## 2015-02-22 NOTE — Therapy (Addendum)
[facility] [address] [place], [place], [address] Phone: [phone]   Fax:  [phone]  Physical Therapy Treatment  Patient Details  Name: Patricia Sanchez MRN: [id number] Date of Birth: 11-12-1990 Referring Provider:  Kathreen CornfieldHoyte, Sandra, MD  Encounter Date: 02/22/2015      PT End of Session - 02/22/15 1635    Visit Number 3   Number of Visits 16   Date for PT Re-Evaluation 03/31/15   PT Start Time 0432   PT Stop Time 0515   PT Time Calculation (min) 43 min      Past Medical History  Diagnosis Date  . Postoperative nausea and vomiting   . Deaf   . Injury of flexor tendon of hand 12/2013    right ring and small fingers    Past Surgical History  Procedure Laterality Date  . Cholecystectomy  11/01/2011    Procedure: LAPAROSCOPIC CHOLECYSTECTOMY;  Surgeon: Atilano InaEric M Wilson, MD;  Location: [facility];  Service: General;  Laterality: N/A;  with intraoperative cholangiogram  . Tonsillectomy and adenoidectomy  2001  . Wisdom tooth extraction  03/26/2011  . Bunionectomy Left   . Hand surgery    . Tendon repair Right 12/29/2013    Procedure: FLEXOR TENDON REPAIR  RIGHT RING FINGER AND RIGHT SMALL FINGER;  Surgeon: Knute NeuHarrill Coley, MD;  Location: [facility];  Service: Plastics;  Laterality: Right;    There were no vitals filed for this visit.  Visit Diagnosis:  Back pain with left-sided sciatica  Joint stiffness of spine      Subjective Assessment - 02/22/15 1636    Subjective No questions and no pain now.  I'm just a little sore from using the muscles to do exercises.     Currently in Pain? No/denies                         [facility] - 02/22/15 1650    Lumbar Exercises: Supine   Ab Set 5 reps;5 seconds   Clam 5 reps;5 seconds   Heel Slides 10 reps   Bent Knee Raise 10 reps;5 seconds   Straight Leg Raise 10 reps  bilateral; cues for abdominal bracing   Lumbar  Exercises: Sidelying   Clam 10 reps   Hip Abduction 10 reps   Modalities   Modalities Moist Heat   Moist Heat Therapy   Number Minutes Moist Heat 15 Minutes   Moist Heat Location --  lumbar sitting                   PT Short Term Goals - 02/20/15 1737    PT SHORT TERM GOAL #1   Title "Independent with initial HEP   Time 4   Period Weeks   Status On-going   PT SHORT TERM GOAL #2   Title "Demonstrate understanding of proper sitting posture and be more conscious of position and posture throughout the day 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   Time 8   Period Weeks   Status On-going   PT LONG TERM GOAL #2   Title "Pt will be independent with advanced HEP needed for return to full ROM and strength for playing basketball and jogging.   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   Time 8   Period Weeks   Status On-going   PT LONG TERM GOAL #5   Title FOTO functional outcome score improved from 45% limit to 33% indicating improved function with less pain.   Time 8   Period Weeks   Status On-going                Plan - 02/22/15 1702    Clinical Impression Statement The pt. reported being more aware of her movement throughout the day and has noticed a difference in her pain level.  She was able to progress to supine lumbar stabilization with clams, SLR, heel slides, and bent knee raises.  She is not able to press her back down into the table due to pain but is able to engage her abdominal muscles.  No pain increase during treatment.     PT Next Visit Plan supine lumbar stabilization, add to HEP, wall slides, sidelying abduction and clams        Problem List Patient Active Problem List   Diagnosis Date Noted  . Deaf 11/01/2011    Fonda KinderLowe,McKinnley, SPTA 02/22/2015, 5:14 PM  [facility] [address] [place], [place], [address] Phone: [phone]   Fax:  [phone]

## 2015-02-27 ENCOUNTER — Ambulatory Visit: Payer: Medicare Other | Admitting: Physical Therapy

## 2015-03-01 ENCOUNTER — Ambulatory Visit: Payer: Medicare Other | Admitting: Physical Therapy

## 2015-03-01 DIAGNOSIS — M5432 Sciatica, left side: Secondary | ICD-10-CM | POA: Diagnosis not present

## 2015-03-01 DIAGNOSIS — M2560 Stiffness of unspecified joint, not elsewhere classified: Secondary | ICD-10-CM

## 2015-03-01 NOTE — Therapy (Signed)
[place], [place], [address] Phone: [phone]   Fax:  [phone]  Physical Therapy Treatment  Patient Details  Name: Patricia Sanchez MRN: [id number] Date of Birth: [birth year] Referring Provider:  Carollee Leitz, MD  Encounter Date: 03/01/2015      PT End of Session - 03/01/15 1730    Visit Number 4   Number of Visits 16   Date for PT Re-Evaluation 03/31/15   PT Start Time 1632   PT Stop Time 1712   PT Time Calculation (min) 40 min   Activity Tolerance Patient tolerated treatment well      Past Medical History  Diagnosis Date  . Postoperative nausea and vomiting   . Deaf   . Injury of flexor tendon of hand 12/2013    right ring and small fingers    Past Surgical History  Procedure Laterality Date  . Cholecystectomy  11/01/2011    Procedure: LAPAROSCOPIC CHOLECYSTECTOMY;  Surgeon: Gayland Curry, MD;  Location: [facility];  Service: General;  Laterality: N/A;  with intraoperative cholangiogram  . Tonsillectomy and adenoidectomy  2001  . Wisdom tooth extraction  03/26/2011  . Bunionectomy Left   . Hand surgery    . Tendon repair Right 12/29/2013    Procedure: FLEXOR TENDON REPAIR  RIGHT RING FINGER AND RIGHT SMALL FINGER;  Surgeon: Dayna Barker, MD;  Location: [place];  Service: Plastics;  Laterality: Right;    There were no vitals filed for this visit.  Visit Diagnosis:  Back pain with left-sided sciatica  Joint stiffness of spine      Subjective Assessment - 03/01/15 1713    Subjective  Thinks today will be her last day.  She is exercising at the gym .  She is more flexible.  She feels she is 35% improved.  She was able to play Basket ball 1 on 1 really hard ball with just a little normal soreness.   Patient is accompained by: Interpreter   Currently in Pain? No/denies   Pain Score --  sometimes she has "Just a little pain" in her back   Pain Location Back   Pain  Orientation Left;Lower   Aggravating Factors  some exercises at gym.  (Twisting with weight)   Pain Relieving Factors PT/ home exercises   Multiple Pain Sites No                         [facility] Adult PT Treatment/Exercise - 03/01/15 1635    Lumbar Exercises: Stretches   Passive Hamstring Stretch 1 rep;30 seconds  sitting om mat.  Asked patient to avoid bouncing with stretc   Standing Side Bend Limitations able to reach knee joint line bilateral.  minimal pain with lt side pend end range.   Standing Extension Limitations limited 50%  much improved, a little pain reported, end range   Lumbar Exercises: Standing   Other Standing Lumbar Exercises Pallof press 10 LBS. 10 reps each.  Asked patient to do these at the gym in place of twist machi   Lumbar Exercises: Sidelying   Other Sidelying Lumbar Exercises plank from knees 5 reps each these were difficult.   Lumbar Exercises: Prone   Other Prone Lumbar Exercises Plank from knees 5 reps very difficult.                PT Education - 03/01/15 1729    Education provided Yes   Education Details Safe  thing to do at the gym, how to stretch, warm up first etc.    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   Time 8   Period Weeks   Status On-going   PT LONG TERM GOAL #2   Title "Pt will be independent with advanced HEP needed for return to full ROM and strength for playing basketball and jogging.   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   Baseline abdominal 5/5.  Extensor strength not tested   Time 8   Period Weeks   Status Partially Met   PT LONG TERM GOAL #5   Title FOTO functional outcome score improved from 45% limit to 33% indicating improved function with less pain.   Time 8   Period Weeks   Status Unable to assess               Plan - 29-Mar-2015 1731    Clinical Impression Statement Patient feele she is ready for discharge.  All goals met or are partially met.   PT Next Visit Plan D/C at patient's request   PT Home Exercise Plan home exercise and gym   Consulted and Agree with Plan of Care Patient          G-Codes - 03-29-2015 0810    Functional Assessment Tool Used  clinical judgement   Functional Limitation Mobility: Walking and moving around   Mobility: Walking and Moving Around Current Status [phone]) At least 40 percent but less than 60 percent impaired, limited or restricted   Mobility: Walking and Moving Around Goal Status ([id number]) At least 20 percent but less than 40 percent impaired, limited or  restricted   Mobility: Walking and Moving Around Discharge Status [phone]) At least 40 percent but  less than 60 percent impaired, limited or restricted      Problem List Patient Active Problem List   Diagnosis Date Noted  . Deaf 11/01/2011    Alvera Singh 03/02/2015, 8:11 AM  Ruben Im, PT 03/02/2015 8:12 AM Phone: [phone] Fax: [facility] [address] [place], [place], [address] Phone: [phone]   Fax:  [phone]  Melvenia Needles, PTA 03/02/2015 8:11 AM Phone: [phone] Fax: [phone]   PHYSICAL THERAPY DISCHARGE SUMMARY  Visits from Start of Care: 4  Current functional level related to goals / functional outcomes: The patient requests discharge secondary to exercising independently at the gym now.  Partial goals met.   Remaining deficits:Pain somewhat improved.  Should make further progress as she continues with HEP.   Education / Equipment: HEP Plan: Patient agrees to discharge.  Patient goals were partially met. Patient is being discharged due to the patient's request.  ?????  Ruben Im, PT 03/02/2015 8:15 AM Phone: [phone] Fax: [phone]

## 2015-03-07 ENCOUNTER — Ambulatory Visit: Payer: Medicare Other | Admitting: Physical Therapy

## 2015-03-09 ENCOUNTER — Encounter: Payer: Medicaid Other | Admitting: Physical Therapy

## 2015-03-14 ENCOUNTER — Encounter: Payer: Medicaid Other | Admitting: Physical Therapy

## 2015-03-16 ENCOUNTER — Encounter: Payer: Medicaid Other | Admitting: Physical Therapy

## 2015-06-07 ENCOUNTER — Emergency Department (HOSPITAL_COMMUNITY): Payer: Medicare Other

## 2015-06-07 ENCOUNTER — Encounter (HOSPITAL_COMMUNITY): Payer: Self-pay | Admitting: *Deleted

## 2015-06-07 ENCOUNTER — Emergency Department (HOSPITAL_COMMUNITY)
Admission: EM | Admit: 2015-06-07 | Discharge: 2015-06-07 | Disposition: A | Discharge status: Home / Self Care | Payer: Medicare Other | Attending: Emergency Medicine | Admitting: Emergency Medicine

## 2015-06-07 DIAGNOSIS — Y999 Unspecified external cause status: Secondary | ICD-10-CM | POA: Diagnosis not present

## 2015-06-07 DIAGNOSIS — Z79899 Other long term (current) drug therapy: Secondary | ICD-10-CM | POA: Diagnosis not present

## 2015-06-07 DIAGNOSIS — Z791 Long term (current) use of non-steroidal anti-inflammatories (NSAID): Secondary | ICD-10-CM | POA: Insufficient documentation

## 2015-06-07 DIAGNOSIS — H9190 Unspecified hearing loss, unspecified ear: Secondary | ICD-10-CM | POA: Insufficient documentation

## 2015-06-07 DIAGNOSIS — Z720 Tobacco use: Secondary | ICD-10-CM | POA: Insufficient documentation

## 2015-06-07 DIAGNOSIS — Y929 Unspecified place or not applicable: Secondary | ICD-10-CM | POA: Diagnosis not present

## 2015-06-07 DIAGNOSIS — Y939 Activity, unspecified: Secondary | ICD-10-CM | POA: Insufficient documentation

## 2015-06-07 DIAGNOSIS — S8991XA Unspecified injury of right lower leg, initial encounter: Secondary | ICD-10-CM | POA: Insufficient documentation

## 2015-06-07 DIAGNOSIS — Z87828 Personal history of other (healed) physical injury and trauma: Secondary | ICD-10-CM | POA: Diagnosis not present

## 2015-06-07 DIAGNOSIS — M25561 Pain in right knee: Secondary | ICD-10-CM

## 2015-06-07 DIAGNOSIS — X58XXXA Exposure to other specified factors, initial encounter: Secondary | ICD-10-CM | POA: Diagnosis not present

## 2015-06-07 DIAGNOSIS — Z792 Long term (current) use of antibiotics: Secondary | ICD-10-CM | POA: Insufficient documentation

## 2015-06-07 MED ORDER — NAPROXEN 250 MG PO TABS
500.0000 mg | ORAL_TABLET | Freq: Once | ORAL | Status: AC
  Administered 2015-06-07: 500 mg via ORAL
  Filled 2015-06-07: qty 2

## 2015-06-07 MED ORDER — DICLOFENAC SODIUM 75 MG PO TBEC: 75.0000 mg | DELAYED_RELEASE_TABLET | Freq: Two times a day (BID) | ORAL | Status: DC

## 2015-06-07 NOTE — Discharge Instructions (Signed)
Knee Pain °The knee is the complex joint between your thigh and your lower leg. It is made up of bones, tendons, ligaments, and cartilage. The bones that make up the knee are: °· The femur in the thigh. °· The tibia and fibula in the lower leg. °· The patella or kneecap riding in the groove on the lower femur. °CAUSES  °Knee pain is a common complaint with many causes. A few of these causes are: °· Injury, such as: °· A ruptured ligament or tendon injury. °· Torn cartilage. °· Medical conditions, such as: °· Gout °· Arthritis °· Infections °· Overuse, over training, or overdoing a physical activity. °Knee pain can be minor or severe. Knee pain can accompany debilitating injury. Minor knee problems often respond well to self-care measures or get well on their own. More serious injuries may need medical intervention or even surgery. °SYMPTOMS °The knee is complex. Symptoms of knee problems can vary widely. Some of the problems are: °· Pain with movement and weight bearing. °· Swelling and tenderness. °· Buckling of the knee. °· Inability to straighten or extend your knee. °· Your knee locks and you cannot straighten it. °· Warmth and redness with pain and fever. °· Deformity or dislocation of the kneecap. °DIAGNOSIS  °Determining what is wrong may be very straight forward such as when there is an injury. It can also be challenging because of the complexity of the knee. Tests to make a diagnosis may include: °· Your caregiver taking a history and doing a physical exam. °· Routine X-rays can be used to rule out other problems. X-rays will not reveal a cartilage tear. Some injuries of the knee can be diagnosed by: °· Arthroscopy a surgical technique by which a small video camera is inserted through tiny incisions on the sides of the knee. This procedure is used to examine and repair internal knee joint problems. Tiny instruments can be used during arthroscopy to repair the torn knee cartilage (meniscus). °· Arthrography  is a radiology technique. A contrast liquid is directly injected into the knee joint. Internal structures of the knee joint then become visible on X-ray film. °· An MRI scan is a non X-ray radiology procedure in which magnetic fields and a computer produce two- or three-dimensional images of the inside of the knee. Cartilage tears are often visible using an MRI scanner. MRI scans have largely replaced arthrography in diagnosing cartilage tears of the knee. °· Blood work. °· Examination of the fluid that helps to lubricate the knee joint (synovial fluid). This is done by taking a sample out using a needle and a syringe. °TREATMENT °The treatment of knee problems depends on the cause. Some of these treatments are: °· Depending on the injury, proper casting, splinting, surgery, or physical therapy care will be needed. °· Give yourself adequate recovery time. Do not overuse your joints. If you begin to get sore during workout routines, back off. Slow down or do fewer repetitions. °· For repetitive activities such as cycling or running, maintain your strength and nutrition. °· Alternate muscle groups. For example, if you are a weight lifter, work the upper body on one day and the lower body the next. °· Either tight or weak muscles do not give the proper support for your knee. Tight or weak muscles do not absorb the stress placed on the knee joint. Keep the muscles surrounding the knee strong. °· Take care of mechanical problems. °¨ If you have flat feet, orthotics or special shoes may help.   See your caregiver if you need help. °¨ Arch supports, sometimes with wedges on the inner or outer aspect of the heel, can help. These can shift pressure away from the side of the knee most bothered by osteoarthritis. °¨ A brace called an "unloader" brace also may be used to help ease the pressure on the most arthritic side of the knee. °· If your caregiver has prescribed crutches, braces, wraps or ice, use as directed. The acronym  for this is PRICE. This means protection, rest, ice, compression, and elevation. °· Nonsteroidal anti-inflammatory drugs (NSAIDs), can help relieve pain. But if taken immediately after an injury, they may actually increase swelling. Take NSAIDs with food in your stomach. Stop them if you develop stomach problems. Do not take these if you have a history of ulcers, stomach pain, or bleeding from the bowel. Do not take without your caregiver's approval if you have problems with fluid retention, heart failure, or kidney problems. °· For ongoing knee problems, physical therapy may be helpful. °· Glucosamine and chondroitin are over-the-counter dietary supplements. Both may help relieve the pain of osteoarthritis in the knee. These medicines are different from the usual anti-inflammatory drugs. Glucosamine may decrease the rate of cartilage destruction. °· Injections of a corticosteroid drug into your knee joint may help reduce the symptoms of an arthritis flare-up. They may provide pain relief that lasts a few months. You may have to wait a few months between injections. The injections do have a small increased risk of infection, water retention, and elevated blood sugar levels. °· Hyaluronic acid injected into damaged joints may ease pain and provide lubrication. These injections may work by reducing inflammation. A series of shots may give relief for as long as 6 months. °· Topical painkillers. Applying certain ointments to your skin may help relieve the pain and stiffness of osteoarthritis. Ask your pharmacist for suggestions. Many over the-counter products are approved for temporary relief of arthritis pain. °· In some countries, doctors often prescribe topical NSAIDs for relief of chronic conditions such as arthritis and tendinitis. A review of treatment with NSAID creams found that they worked as well as oral medications but without the serious side effects. °PREVENTION °· Maintain a healthy weight. Extra pounds  put more strain on your joints. °· Get strong, stay limber. Weak muscles are a common cause of knee injuries. Stretching is important. Include flexibility exercises in your workouts. °· Be smart about exercise. If you have osteoarthritis, chronic knee pain or recurring injuries, you may need to change the way you exercise. This does not mean you have to stop being active. If your knees ache after jogging or playing basketball, consider switching to swimming, water aerobics, or other low-impact activities, at least for a few days a week. Sometimes limiting high-impact activities will provide relief. °· Make sure your shoes fit well. Choose footwear that is right for your sport. °· Protect your knees. Use the proper gear for knee-sensitive activities. Use kneepads when playing volleyball or laying carpet. Buckle your seat belt every time you drive. Most shattered kneecaps occur in car accidents. °· Rest when you are tired. °SEEK MEDICAL CARE IF:  °You have knee pain that is continual and does not seem to be getting better.  °SEEK IMMEDIATE MEDICAL CARE IF:  °Your knee joint feels hot to the touch and you have a high fever. °MAKE SURE YOU:  °· Understand these instructions. °· Will watch your condition. °· Will get help right away if you are not   doing well or get worse. °Document Released: 08/04/2007 Document Revised: 12/30/2011 Document Reviewed: 08/04/2007 °ExitCare® Patient Information ©2015 [employer]. This information is not intended to replace advice given to you by your health care provider. Make sure you discuss any questions you have with your health care provider. ° °Arthralgia °Your caregiver has diagnosed you as suffering from an arthralgia. Arthralgia means there is pain in a joint. This can come from many reasons including: °· Bruising the joint which causes soreness (inflammation) in the joint. °· Wear and tear on the joints which occur as we grow older (osteoarthritis). °· Overusing the  joint. °· Various forms of arthritis. °· Infections of the joint. °Regardless of the cause of pain in your joint, most of these different pains respond to anti-inflammatory drugs and rest. The exception to this is when a joint is infected, and these cases are treated with antibiotics, if it is a bacterial infection. °HOME CARE INSTRUCTIONS  °· Rest the injured area for as long as directed by your caregiver. Then slowly start using the joint as directed by your caregiver and as the pain allows. Crutches as directed may be useful if the ankles, knees or hips are involved. If the knee was splinted or casted, continue use and care as directed. If an stretchy or elastic wrapping bandage has been applied today, it should be removed and re-applied every 3 to 4 hours. It should not be applied tightly, but firmly enough to keep swelling down. Watch toes and feet for swelling, bluish discoloration, coldness, numbness or excessive pain. If any of these problems (symptoms) occur, remove the ace bandage and re-apply more loosely. If these symptoms persist, contact your caregiver or return to this location. °· For the first 24 hours, keep the injured extremity elevated on pillows while lying down. °· Apply ice for 15-20 minutes to the sore joint every couple hours while awake for the first half day. Then 03-04 times per day for the first 48 hours. Put the ice in a plastic bag and place a towel between the bag of ice and your skin. °· Wear any splinting, casting, elastic bandage applications, or slings as instructed. °· Only take over-the-counter or prescription medicines for pain, discomfort, or fever as directed by your caregiver. Do not use aspirin immediately after the injury unless instructed by your physician. Aspirin can cause increased bleeding and bruising of the tissues. °· If you were given crutches, continue to use them as instructed and do not resume weight bearing on the sore joint until instructed. °Persistent pain  and inability to use the sore joint as directed for more than 2 to 3 days are warning signs indicating that you should see a caregiver for a follow-up visit as soon as possible. Initially, a hairline fracture (break in bone) may not be evident on X-rays. Persistent pain and swelling indicate that further evaluation, non-weight bearing or use of the joint (use of crutches or slings as instructed), or further X-rays are indicated. X-rays may sometimes not show a small fracture until a week or 10 days later. Make a follow-up appointment with your own caregiver or one to whom we have referred you. A radiologist (specialist in reading X-rays) may read your X-rays. Make sure you know how you are to obtain your X-ray results. Do not assume everything is normal if you do not hear from us. °SEEK MEDICAL CARE IF: °Bruising, swelling, or pain increases. °SEEK IMMEDIATE MEDICAL CARE IF:  °· Your fingers or toes are numb   or blue. °· The pain is not responding to medications and continues to stay the same or get worse. °· The pain in your joint becomes severe. °· You develop a fever over 102° F (38.9° C). °· It becomes impossible to move or use the joint. °MAKE SURE YOU:  °· Understand these instructions. °· Will watch your condition. °· Will get help right away if you are not doing well or get worse. °Document Released: 10/07/2005 Document Revised: 12/30/2011 Document Reviewed: 05/25/2008 °ExitCare® Patient Information ©2015 [employer]. This information is not intended to replace advice given to you by your health care provider. Make sure you discuss any questions you have with your health care provider. ° °

## 2015-06-07 NOTE — ED Notes (Signed)
Declined W/C at D/C and was escorted to lobby by RN. 

## 2015-06-07 NOTE — ED Provider Notes (Signed)
CSN: [id number]     Arrival date & time 06/07/15  0915 History  This chart was scribed for non-physician practitioner, Fayrene Helper, PA-C, working with Eber Hong, MD by Charline Bills, ED Scribe. This patient was seen in room TR07C/TR07C and the patient's care was started at 9:28 AM.   Chief Complaint  Patient presents with  . Knee Pain   The history is provided by the patient. The history is limited by a language barrier (deaf). A language interpreter was used.   HPI Comments: Patricia Sanchez is a 24 y.o. female who presents to the Emergency Department complaining of gradually worsening, constant right knee pain for 3 weeks. Pt is deaf; communication through writing. Pt denies injury but states that she stands a lot at work. She reports 10/10 pain last night, 8/10 at this time. She further describes pain as a sharp, non-radiating throbbing sensation that is exacerbated with bending, ambulating and palpation. She denies h/o similar pain. She also reports intermittent "popping" in her knee, intermittent tingling and associated joint swelling. She suspects that this could be arthritis. Pt plays basketball but denies previous knee injury. Pt has tried Tylenol without significant relief. She denies fever, right hip, calf or ankle pain. No h/o DVT/PE, recent surgery, prolonged bed rest, active CA.   Past Medical History  Diagnosis Date  . Postoperative nausea and vomiting   . Deaf   . Injury of flexor tendon of hand 12/2013    right ring and small fingers   Past Surgical History  Procedure Laterality Date  . Cholecystectomy  11/01/2011    Procedure: LAPAROSCOPIC CHOLECYSTECTOMY;  Surgeon: Atilano Ina, MD;  Location: [facility];  Service: General;  Laterality: N/A;  with intraoperative cholangiogram  . Tonsillectomy and adenoidectomy  2001  . Wisdom tooth extraction  03/26/2011  . Bunionectomy Left   . Hand surgery    . Tendon repair Right 12/29/2013    Procedure: FLEXOR TENDON REPAIR  RIGHT  RING FINGER AND RIGHT SMALL FINGER;  Surgeon: Knute Neu, MD;  Location: [facility];  Service: Plastics;  Laterality: Right;   Family History  Problem Relation Age of Onset  . Hypertension Mother    Social History  Substance Use Topics  . Smoking status: Current Some Day Smoker -- 5 years    Types: Cigarettes  . Smokeless tobacco: Never Used     Comment: 2-3 cig./day  . Alcohol Use: 0.0 oz/week    0 Standard drinks or equivalent per week   OB History    No data available     Review of Systems  Constitutional: Negative for fever.  Musculoskeletal: Positive for joint swelling and arthralgias.   Allergies  Review of patient's allergies indicates no known allergies.  Home Medications   Prior to Admission medications   Medication Sig Start Date End Date Taking? Authorizing Provider  celecoxib (CELEBREX) 200 MG capsule Take 200 mg by mouth daily.    Historical Provider, MD  cephALEXin (KEFLEX) 500 MG capsule Take 500 mg by mouth 4 (four) times daily.    Historical Provider, MD  cyclobenzaprine (FLEXERIL) 10 MG tablet Take 1 tablet (10 mg total) by mouth 2 (two) times daily as needed for muscle spasms. Patient not taking: Reported on 02/03/2015 08/31/14   Junius Finner, PA-C  diclofenac (VOLTAREN) 75 MG EC tablet Take 75 mg by mouth 2 (two) times daily.    Historical Provider, MD  HYDROcodone-acetaminophen (NORCO/VICODIN) 5-325 MG per tablet Take 1 tablet by mouth every  6 (six) hours as needed for moderate pain.    Historical Provider, MD  ibuprofen (ADVIL,MOTRIN) 600 MG tablet Take 1 tablet (600 mg total) by mouth every 6 (six) hours as needed. Patient not taking: Reported on 02/03/2015 08/31/14   Junius Finner, PA-C  meloxicam (MOBIC) 7.5 MG tablet Take 7.5 mg by mouth daily.    Historical Provider, MD  Multiple Vitamin (MULTIVITAMIN) tablet Take 1 tablet by mouth daily.    Historical Provider, MD  omeprazole (PRILOSEC) 20 MG capsule Take 20 mg by mouth daily.     Historical Provider, MD  oxyCODONE-acetaminophen (PERCOCET/ROXICET) 5-325 MG per tablet Take by mouth every 4 (four) hours as needed for severe pain.    Historical Provider, MD  prochlorperazine (COMPAZINE) 10 MG tablet Take 1 tablet (10 mg total) by mouth every 8 (eight) hours as needed for nausea or vomiting. Patient not taking: Reported on 02/03/2015 06/13/14   Mancel Bale, MD  promethazine (PHENERGAN) 50 MG tablet Take 50 mg by mouth every 6 (six) hours as needed for nausea or vomiting.    Historical Provider, MD  traMADol (ULTRAM) 50 MG tablet Take 1 tablet (50 mg total) by mouth every 6 (six) hours as needed. Patient not taking: Reported on 02/03/2015 08/31/14   Junius Finner, PA-C  traZODone (DESYREL) 100 MG tablet Take 100 mg by mouth at bedtime.    Historical Provider, MD   BP 137/88 mmHg  Pulse 65  Temp(Src) 98.4 F (36.9 C) (Oral)  Resp 16  SpO2 100% Physical Exam  Constitutional: She is oriented to person, place, and time. She appears well-developed and well-nourished. No distress.  HENT:  Head: Normocephalic and atraumatic.  Eyes: Conjunctivae and EOM are normal.  Neck: Neck supple. No tracheal deviation present.  Cardiovascular: Normal rate.   Pulmonary/Chest: Effort normal. No respiratory distress.  Musculoskeletal: Normal range of motion.       Right hip: Normal.       Right ankle: Normal.  R knee: Tenderness to the anterior knee. Most significant at the tibial tuberosity. Pain with varus and valgus maneuver. Normal anterior drawer test. Normal flexion and extension. No evidence of joint effusion or overlying skin changes. Pt is able to ambulate.  Neurological: She is alert and oriented to person, place, and time.  Skin: Skin is warm and dry.  Psychiatric: She has a normal mood and affect. Her behavior is normal.  Nursing note and vitals reviewed.  ED Course  Procedures (including critical care time) DIAGNOSTIC STUDIES: Oxygen Saturation is 100% on RA, normal by my  interpretation.    COORDINATION OF CARE: 9:39 AM-Discussed treatment plan which includes XR. If XR is normal pt will be given a knee sleeve and advised to follow-up with ortho. Pt agreed to plan.   11:02 AM X-ray of right knee shows no acute abnormalities. Patient will receive a knee sleeve, and follow-up with Ortho for further care. Otherwise she is neurovascularly intact and able to cannulate. Rice therapy discussed.  Labs Review Labs Reviewed - No data to display  Imaging Review Dg Knee Complete 4 Views Right  06/07/2015   CLINICAL DATA:  Increasing right knee pain over the last 3 weeks. No known injury.  EXAM: RIGHT KNEE - COMPLETE 4+ VIEW  COMPARISON:  None.  FINDINGS: There is no evidence of fracture, dislocation, or joint effusion. There is no evidence of arthropathy or other focal bone abnormality. Soft tissues are unremarkable.  IMPRESSION: Negative.   Electronically Signed   By: Charlett Nose  M.D.   On: 06/07/2015 10:50   I have personally reviewed and evaluated these images and lab results as part of my medical decision-making.   EKG Interpretation None      MDM   Final diagnoses:  Right anterior knee pain    BP 137/88 mmHg  Pulse 65  Temp(Src) 98.4 F (36.9 C) (Oral)  Resp 16  SpO2 100%  LMP 05/07/2015   I personally performed the services described in this documentation, which was scribed in my presence. The recorded information has been reviewed and is accurate.     Fayrene Helper, PA-C 06/07/15 1103  Eber Hong, MD 06/07/15 1115

## 2015-06-07 NOTE — ED Notes (Signed)
Pt presents with RT knee pain

## 2016-06-17 ENCOUNTER — Emergency Department (HOSPITAL_COMMUNITY): Payer: Medicare Other

## 2016-06-17 ENCOUNTER — Emergency Department (HOSPITAL_COMMUNITY)
Admission: EM | Admit: 2016-06-17 | Discharge: 2016-06-17 | Disposition: A | Discharge status: Home / Self Care | Payer: Medicare Other | Attending: Emergency Medicine | Admitting: Emergency Medicine

## 2016-06-17 ENCOUNTER — Encounter (HOSPITAL_COMMUNITY): Payer: Self-pay | Admitting: Emergency Medicine

## 2016-06-17 DIAGNOSIS — Z79899 Other long term (current) drug therapy: Secondary | ICD-10-CM | POA: Insufficient documentation

## 2016-06-17 DIAGNOSIS — N83201 Unspecified ovarian cyst, right side: Secondary | ICD-10-CM | POA: Insufficient documentation

## 2016-06-17 DIAGNOSIS — N979 Female infertility, unspecified: Secondary | ICD-10-CM | POA: Insufficient documentation

## 2016-06-17 DIAGNOSIS — R1031 Right lower quadrant pain: Secondary | ICD-10-CM | POA: Diagnosis present

## 2016-06-17 DIAGNOSIS — F17210 Nicotine dependence, cigarettes, uncomplicated: Secondary | ICD-10-CM | POA: Diagnosis not present

## 2016-06-17 LAB — URINALYSIS, ROUTINE W REFLEX MICROSCOPIC
Bilirubin Urine: NEGATIVE
Glucose, UA: NEGATIVE mg/dL
Hgb urine dipstick: NEGATIVE
Ketones, ur: NEGATIVE mg/dL
LEUKOCYTES UA: NEGATIVE
Nitrite: NEGATIVE
PROTEIN: NEGATIVE mg/dL
Specific Gravity, Urine: 1.028 (ref 1.005–1.030)
pH: 6 (ref 5.0–8.0)

## 2016-06-17 LAB — CBC WITH DIFFERENTIAL/PLATELET
Basophils Absolute: 0 10*3/uL (ref 0.0–0.1)
Basophils Relative: 0 %
EOS ABS: 0.5 10*3/uL (ref 0.0–0.7)
Eosinophils Relative: 10 %
HCT: 39.8 % (ref 36.0–46.0)
Hemoglobin: 13.3 g/dL (ref 12.0–15.0)
Lymphocytes Relative: 52 %
Lymphs Abs: 2.3 10*3/uL (ref 0.7–4.0)
MCH: 29.7 pg (ref 26.0–34.0)
MCHC: 33.4 g/dL (ref 30.0–36.0)
MCV: 88.8 fL (ref 78.0–100.0)
MONO ABS: 0.3 10*3/uL (ref 0.1–1.0)
Monocytes Relative: 7 %
NEUTROS PCT: 31 %
Neutro Abs: 1.4 10*3/uL — ABNORMAL LOW (ref 1.7–7.7)
PLATELETS: 237 10*3/uL (ref 150–400)
RBC: 4.48 MIL/uL (ref 3.87–5.11)
RDW: 13.4 % (ref 11.5–15.5)
WBC: 4.5 10*3/uL (ref 4.0–10.5)

## 2016-06-17 LAB — COMPREHENSIVE METABOLIC PANEL
ALT: 16 U/L (ref 14–54)
AST: 17 U/L (ref 15–41)
Albumin: 4.3 g/dL (ref 3.5–5.0)
Alkaline Phosphatase: 44 U/L (ref 38–126)
Anion gap: 7 (ref 5–15)
BILIRUBIN TOTAL: 0.4 mg/dL (ref 0.3–1.2)
BUN: 9 mg/dL (ref 6–20)
CALCIUM: 8.9 mg/dL (ref 8.9–10.3)
CHLORIDE: 110 mmol/L (ref 101–111)
CO2: 22 mmol/L (ref 22–32)
CREATININE: 1.09 mg/dL — AB (ref 0.44–1.00)
Glucose, Bld: 94 mg/dL (ref 65–99)
Potassium: 3.5 mmol/L (ref 3.5–5.1)
Sodium: 139 mmol/L (ref 135–145)
TOTAL PROTEIN: 7.6 g/dL (ref 6.5–8.1)

## 2016-06-17 LAB — WET PREP, GENITAL
Clue Cells Wet Prep HPF POC: NONE SEEN
Sperm: NONE SEEN
Trich, Wet Prep: NONE SEEN
WBC, Wet Prep HPF POC: NONE SEEN
YEAST WET PREP: NONE SEEN

## 2016-06-17 LAB — I-STAT BETA HCG BLOOD, ED (MC, WL, AP ONLY)

## 2016-06-17 LAB — LIPASE, BLOOD: LIPASE: 36 U/L (ref 11–51)

## 2016-06-17 MED ORDER — KETOROLAC TROMETHAMINE 30 MG/ML IJ SOLN
60.0000 mg | Freq: Once | INTRAMUSCULAR | Status: AC
  Administered 2016-06-17: 60 mg via INTRAVENOUS
  Filled 2016-06-17: qty 2

## 2016-06-17 MED ORDER — HYDROCODONE-ACETAMINOPHEN 5-325 MG PO TABS: ORAL_TABLET | ORAL | 0 refills | Status: DC

## 2016-06-17 MED ORDER — ACETAMINOPHEN 325 MG PO TABS
650.0000 mg | ORAL_TABLET | Freq: Once | ORAL | Status: AC
  Administered 2016-06-17: 650 mg via ORAL
  Filled 2016-06-17: qty 2

## 2016-06-17 NOTE — Discharge Instructions (Signed)
Take vicodin for breakthrough pain, do not drink alcohol, drive, care for children or do other critical tasks while taking vicodin.  Please follow with your primary care doctor in the next 2 days for a check-up. They must obtain records for further management.   Do not hesitate to return to the Emergency Department for any new, worsening or concerning symptoms.    Please follow with your primary care doctor in the next 5 days for high blood pressure evaluation. If you do not have a primary care doctor, present to urgent care. Reduce salt intake. Seek emergency medical care for unilateral weakness, slurring, change in vision, or chest pain and shortness of breath.

## 2016-06-17 NOTE — ED Triage Notes (Addendum)
Patient c/o RLQ abdominal pain onset 1 week ago, sometimes worse after emptying bladder. No nausea, vomiting, diarrhea, vaginal bleeding, urinary symtpoms. No sexual activity with males, states no chance of pregnancy. Patient reports possible undiagnosed PCOS due to hirsutism and menstrual irregularity.

## 2016-06-17 NOTE — ED Provider Notes (Signed)
Patricia Sanchez Provider Note   CSN: [id number] Arrival date & time: 06/17/16  0749     History   Chief Complaint Chief Complaint  Patient presents with  . Abdominal Pain    deaf    HPI  Temperature 97.9 F (36.6 C), temperature source Oral, last menstrual period 06/03/2016.  Patient deaf, interpreter used.  Patricia Sanchez is a 25 y.o. female complaining of right lower quadrant pain intermittent up to 6 out of 10 onset several days ago not associated with fever, chills, nausea, vomiting, decreased by mouth intake, abnormal vaginal discharge, dysuria, hematuria, flank pain. Sure last menstrual period was 2 weeks ago, no similar prior pain in the past, no history of ovarian cysts, does not have OB/GYN. Patient is virginal and, concern about possible PCOS due to hirsutism and menstrual irregularity.  HPI  Past Medical History:  Diagnosis Date  . Deaf   . Injury of flexor tendon of hand 12/2013   right ring and small fingers  . Postoperative nausea and vomiting     Patient Active Problem List   Diagnosis Date Noted  . Deaf 11/01/2011    Past Surgical History:  Procedure Laterality Date  . BUNIONECTOMY Left   . CHOLECYSTECTOMY  11/01/2011   Procedure: LAPAROSCOPIC CHOLECYSTECTOMY;  Surgeon: Atilano Ina, MD;  Location: [facility];  Service: General;  Laterality: N/A;  with intraoperative cholangiogram  . HAND SURGERY    . TENDON REPAIR Right 12/29/2013   Procedure: FLEXOR TENDON REPAIR  RIGHT RING FINGER AND RIGHT SMALL FINGER;  Surgeon: Knute Neu, MD;  Location: [facility];  Service: Plastics;  Laterality: Right;  . TONSILLECTOMY AND ADENOIDECTOMY  2001  . WISDOM TOOTH EXTRACTION  03/26/2011    OB History    No data available       Home Medications    Prior to Admission medications   Medication Sig Start Date End Date Taking? Authorizing Provider  celecoxib (CELEBREX) 200 MG capsule Take 200 mg by mouth daily.    Historical Provider, MD    cephALEXin (KEFLEX) 500 MG capsule Take 500 mg by mouth 4 (four) times daily.    Historical Provider, MD  HYDROcodone-acetaminophen (NORCO/VICODIN) 5-325 MG tablet Take 1-2 tablets by mouth every 6 hours as needed for pain and/or cough. 06/17/16   Kanae Ignatowski, PA-C  meloxicam (MOBIC) 7.5 MG tablet Take 7.5 mg by mouth daily.    Historical Provider, MD  Multiple Vitamin (MULTIVITAMIN) tablet Take 1 tablet by mouth daily.    Historical Provider, MD  omeprazole (PRILOSEC) 20 MG capsule Take 20 mg by mouth daily.    Historical Provider, MD  oxyCODONE-acetaminophen (PERCOCET/ROXICET) 5-325 MG per tablet Take by mouth every 4 (four) hours as needed for severe pain.    Historical Provider, MD  promethazine (PHENERGAN) 50 MG tablet Take 50 mg by mouth every 6 (six) hours as needed for nausea or vomiting.    Historical Provider, MD  traZODone (DESYREL) 100 MG tablet Take 100 mg by mouth at bedtime.    Historical Provider, MD    Family History Family History  Problem Relation Age of Onset  . Hypertension Mother     Social History Social History  Substance Use Topics  . Smoking status: Current Some Day Smoker    Years: 5.00    Types: Cigarettes  . Smokeless tobacco: Never Used     Comment: 2-3 cig./day  . Alcohol use 0.0 oz/week     Allergies   Review of patient's  allergies indicates no known allergies.   Review of Systems Review of Systems  10 systems reviewed and found to be negative, except as noted in the HPI.   Physical Exam Updated Vital Signs BP 112/92 (BP Location: Left Arm)   Pulse (!) 58   Temp 97.9 F (36.6 C) (Oral)   Resp 16   LMP 06/03/2016   SpO2 100%   Physical Exam  Constitutional: She is oriented to person, place, and time. She appears well-developed and well-nourished. No distress.  HENT:  Head: Normocephalic and atraumatic.  Mouth/Throat: Oropharynx is clear and moist.  Eyes: Conjunctivae and EOM are normal. Pupils are equal, round, and reactive to  light.  Neck: Normal range of motion.  Cardiovascular: Normal rate, regular rhythm and intact distal pulses.   Pulmonary/Chest: Effort normal and breath sounds normal.  Abdominal: Soft. There is no tenderness.  Genitourinary:  Genitourinary Comments: Pelvic exam a chaperoned by nurse: No rashes or lesions, no abnormal vaginal discharge, no cervical motion or adnexal tenderness.  Musculoskeletal: Normal range of motion.  Neurological: She is alert and oriented to person, place, and time.  Skin: She is not diaphoretic.  Psychiatric: She has a normal mood and affect.  Nursing note and vitals reviewed.    ED Treatments / Results  Labs (all labs ordered are listed, but only abnormal results are displayed) Labs Reviewed  URINALYSIS, ROUTINE W REFLEX MICROSCOPIC (NOT AT [facility]) - Abnormal; Notable for the following:       Result Value   APPearance CLOUDY (*)    All other components within normal limits  CBC WITH DIFFERENTIAL/PLATELET - Abnormal; Notable for the following:    Neutro Abs 1.4 (*)    All other components within normal limits  COMPREHENSIVE METABOLIC PANEL - Abnormal; Notable for the following:    Creatinine, Ser 1.09 (*)    All other components within normal limits  WET PREP, GENITAL  LIPASE, BLOOD  RPR  HIV ANTIBODY (ROUTINE TESTING)  I-STAT BETA HCG BLOOD, ED ([facility], [facility], [facility] ONLY)  GC/CHLAMYDIA PROBE AMP ([place]) NOT AT [facility]    EKG  EKG Interpretation None       Radiology US Transvaginal Non-ob  Result Date: 06/17/2016 CLINICAL DATA:  25 year old female with right pelvic pain for 1 week. EXAM: TRANSABDOMINAL ULTRASOUND OF PELVIS DOPPLER ULTRASOUND OF OVARIES TECHNIQUE: Transabdominal ultrasound examination of the pelvis was performed including evaluation of the uterus, ovaries, adnexal regions, and pelvic cul-de-sac. Color and duplex Doppler ultrasound was utilized to evaluate blood flow to the ovaries. COMPARISON:  None. FINDINGS: Uterus Measurements:  Retroverted measuring 7.6 x 3.4 x 4.8 cm. No fibroids or other mass visualized. Endometrium Thickness: 12 mm. No focal abnormality visualized. Right ovary Measurements: 5.8 x 2.6 x 5.2 cm. Medially adjacent to the right ovary is a 3.3 x 1.8 x 2.4 cm complex oval mass with internal vascular flow. No gross internal calcifications are identified. Left ovary Measurements: 2.1 x 1.4 x 1.6 cm. Normal appearance/no adnexal mass. Pulsed Doppler evaluation demonstrates normal low-resistance arterial and venous waveforms in both ovaries. A trace amount of free pelvic fluid is noted. IMPRESSION: 3.3 x 1.8 x 2.4 cm complex right adnexal mass which may be connected to the right ovary. Correlate with pregnancy test as ectopic pregnancy is not excluded. Otherwise, pelvic MRI with and without contrast and/or GYN consultation is recommended for further evaluation. Retroverted uterus. No evidence of ovarian torsion. Electronically Signed   By: Harmon Pier M.D.   On: 06/17/2016 10:03  US Pelvis Complete  Result Date: 06/17/2016 CLINICAL DATA:  25 year old female with right pelvic pain for 1 week. EXAM: TRANSABDOMINAL ULTRASOUND OF PELVIS DOPPLER ULTRASOUND OF OVARIES TECHNIQUE: Transabdominal ultrasound examination of the pelvis was performed including evaluation of the uterus, ovaries, adnexal regions, and pelvic cul-de-sac. Color and duplex Doppler ultrasound was utilized to evaluate blood flow to the ovaries. COMPARISON:  None. FINDINGS: Uterus Measurements: Retroverted measuring 7.6 x 3.4 x 4.8 cm. No fibroids or other mass visualized. Endometrium Thickness: 12 mm. No focal abnormality visualized. Right ovary Measurements: 5.8 x 2.6 x 5.2 cm. Medially adjacent to the right ovary is a 3.3 x 1.8 x 2.4 cm complex oval mass with internal vascular flow. No gross internal calcifications are identified. Left ovary Measurements: 2.1 x 1.4 x 1.6 cm. Normal appearance/no adnexal mass. Pulsed Doppler evaluation demonstrates normal  low-resistance arterial and venous waveforms in both ovaries. A trace amount of free pelvic fluid is noted. IMPRESSION: 3.3 x 1.8 x 2.4 cm complex right adnexal mass which may be connected to the right ovary. Correlate with pregnancy test as ectopic pregnancy is not excluded. Otherwise, pelvic MRI with and without contrast and/or GYN consultation is recommended for further evaluation. Retroverted uterus. No evidence of ovarian torsion. Electronically Signed   By: Harmon Pier M.D.   On: 06/17/2016 10:03   [place] Art/ven Flow Abd Pelv Doppler  Result Date: 06/17/2016 CLINICAL DATA:  25 year old female with right pelvic pain for 1 week. EXAM: TRANSABDOMINAL ULTRASOUND OF PELVIS DOPPLER ULTRASOUND OF OVARIES TECHNIQUE: Transabdominal ultrasound examination of the pelvis was performed including evaluation of the uterus, ovaries, adnexal regions, and pelvic cul-de-sac. Color and duplex Doppler ultrasound was utilized to evaluate blood flow to the ovaries. COMPARISON:  None. FINDINGS: Uterus Measurements: Retroverted measuring 7.6 x 3.4 x 4.8 cm. No fibroids or other mass visualized. Endometrium Thickness: 12 mm. No focal abnormality visualized. Right ovary Measurements: 5.8 x 2.6 x 5.2 cm. Medially adjacent to the right ovary is a 3.3 x 1.8 x 2.4 cm complex oval mass with internal vascular flow. No gross internal calcifications are identified. Left ovary Measurements: 2.1 x 1.4 x 1.6 cm. Normal appearance/no adnexal mass. Pulsed Doppler evaluation demonstrates normal low-resistance arterial and venous waveforms in both ovaries. A trace amount of free pelvic fluid is noted. IMPRESSION: 3.3 x 1.8 x 2.4 cm complex right adnexal mass which may be connected to the right ovary. Correlate with pregnancy test as ectopic pregnancy is not excluded. Otherwise, pelvic MRI with and without contrast and/or GYN consultation is recommended for further evaluation. Retroverted uterus. No evidence of ovarian torsion. Electronically Signed    By: Harmon Pier M.D.   On: 06/17/2016 10:03    Procedures Procedures (including critical care time)  Medications Ordered in ED Medications  acetaminophen (TYLENOL) tablet 650 mg (650 mg Oral Given 06/17/16 0949)  ketorolac (TORADOL) 30 MG/ML injection 60 mg (60 mg Intravenous Given 06/17/16 1101)     Initial Impression / Assessment and Plan / ED Course  I have reviewed the triage vital signs and the nursing notes.  Pertinent labs & imaging results that were available during my care of the patient were reviewed by me and considered in my medical decision making (see chart for details).  Clinical Course    Vitals:   06/17/16 0805 06/17/16 0820 06/17/16 0848 06/17/16 1021  BP: 142/93   112/92  Pulse: 85   (!) 58  Resp:   18 16  Temp:  97.9 F (36.6 C)  TempSrc:  Oral    SpO2: 100%   100%    Medications  acetaminophen (TYLENOL) tablet 650 mg (650 mg Oral Given 06/17/16 0949)  ketorolac (TORADOL) 30 MG/ML injection 60 mg (60 mg Intravenous Given 06/17/16 1101)    Noorah J Allen-Draft is 25 y.o. female presenting with Intermittent right lower quadrant abdominal pain, abdominal exam is benign with no tenderness to deep palpation of any quadrant, patient is afebrile, overall very well appearing, I doubt this is appendicitis. Pelvic exam unremarkable. Will obtain basic blood work, urinalysis and pelvic ultrasound to evaluate for ovarian cyst/intermittent torsion.  Blood work reassuring with no significant abnormality, wet prep normal. Ultrasound shows a 3 cm complex right adnexal mass. Radiologists recommends correlation with pregnancy test which is negative, I doubt this is an ectopic. Normal color-flow, no signs of torsion. Will recommend high-dose NSAIDs and follow-up with OB/GYN.  Discussed case with attending physician who agrees with care plan and disposition.   Stress results with patient via translator phone, she is also stating that she's been trying to get pregnant for 2  years, will give her referral to women's health. States that her pain is coming back and more severe, will give 60 Toradol via IV.  Evaluation does not show pathology that would require ongoing emergent intervention or inpatient treatment. Pt is hemodynamically stable and mentating appropriately. Discussed findings and plan with patient/guardian, who agrees with care plan. All questions answered. Return precautions discussed and outpatient follow up given.      Final Clinical Impressions(s) / ED Diagnoses   Final diagnoses:  Right ovarian cyst  Infertility, female    New Prescriptions Discharge Medication List as of 06/17/2016 10:52 AM       Wynetta EmeryNicole Loye Reininger, PA-C 06/17/16 1615    Doug SouSam Jacubowitz, MD 06/17/16 1624

## 2016-06-17 NOTE — ED Notes (Signed)
Patient transported to Ultrasound 

## 2016-06-17 NOTE — ED Notes (Signed)
Patient verbalizes understanding. Ambulatory to lobby. NAD noted.

## 2016-06-18 LAB — HIV ANTIBODY (ROUTINE TESTING W REFLEX): HIV SCREEN 4TH GENERATION: NONREACTIVE

## 2016-06-18 LAB — GC/CHLAMYDIA PROBE AMP (~~LOC~~) NOT AT ARMC
Chlamydia: NEGATIVE
NEISSERIA GONORRHEA: NEGATIVE

## 2016-06-18 LAB — RPR: RPR: NONREACTIVE

## 2016-06-26 ENCOUNTER — Emergency Department (HOSPITAL_COMMUNITY)
Admission: EM | Admit: 2016-06-26 | Discharge: 2016-06-26 | Disposition: A | Discharge status: Home / Self Care | Payer: Medicare Other | Attending: Emergency Medicine | Admitting: Emergency Medicine

## 2016-06-26 ENCOUNTER — Encounter (HOSPITAL_COMMUNITY): Payer: Self-pay | Admitting: *Deleted

## 2016-06-26 DIAGNOSIS — Y939 Activity, unspecified: Secondary | ICD-10-CM | POA: Diagnosis not present

## 2016-06-26 DIAGNOSIS — IMO0002 Reserved for concepts with insufficient information to code with codable children: Secondary | ICD-10-CM

## 2016-06-26 DIAGNOSIS — Z79899 Other long term (current) drug therapy: Secondary | ICD-10-CM | POA: Insufficient documentation

## 2016-06-26 DIAGNOSIS — Y999 Unspecified external cause status: Secondary | ICD-10-CM | POA: Insufficient documentation

## 2016-06-26 DIAGNOSIS — S61210A Laceration without foreign body of right index finger without damage to nail, initial encounter: Secondary | ICD-10-CM | POA: Insufficient documentation

## 2016-06-26 DIAGNOSIS — W268XXA Contact with other sharp object(s), not elsewhere classified, initial encounter: Secondary | ICD-10-CM | POA: Diagnosis not present

## 2016-06-26 DIAGNOSIS — Y929 Unspecified place or not applicable: Secondary | ICD-10-CM | POA: Diagnosis not present

## 2016-06-26 DIAGNOSIS — F17210 Nicotine dependence, cigarettes, uncomplicated: Secondary | ICD-10-CM | POA: Insufficient documentation

## 2016-06-26 MED ORDER — IBUPROFEN 800 MG PO TABS
800.0000 mg | ORAL_TABLET | Freq: Once | ORAL | Status: AC
  Administered 2016-06-26: 800 mg via ORAL
  Filled 2016-06-26: qty 1

## 2016-06-26 MED ORDER — LIDOCAINE-EPINEPHRINE 2 %-1:100000 IJ SOLN: 20.0000 mL | Freq: Once | INTRAMUSCULAR | Status: DC

## 2016-06-26 MED ORDER — LIDOCAINE HCL 2 % IJ SOLN
INTRAMUSCULAR | Status: AC
Start: 2016-06-26 — End: 2016-06-26
  Administered 2016-06-26: 20 mg
  Filled 2016-06-26: qty 20

## 2016-06-26 NOTE — ED Provider Notes (Signed)
[facility]-EMERGENCY DEPT Provider Note   CSN: [id number] Arrival date & time: 06/26/16  1652  By signing my name below, I, Emmanuella Mensah, attest that this documentation has been prepared under the direction and in the presence of Arthor Captain, PA-C. Electronically Signed: Angelene Giovanni, ED Scribe. 06/26/16. 8:17 PM.    History   Chief Complaint Chief Complaint  Patient presents with  . Extremity Laceration   HPI Comments: Patricia Sanchez is a 25 y.o. female with a hx of deafness who presents to the Emergency Department complaining of laceration to the dorsum of her right first finger s/p injury that occurred at 3:30 pm today. Pt explains that she was washing dishes when a broken cup lacerated her finger. No alleviating factors noted. She reports that her tetanus vaccine is UTD. She denies any fever, chills, numbness/tingling in that finger, or generalized rash.   The history is provided by the patient. A language interpreter was used (Sign language interpreter ).    Past Medical History:  Diagnosis Date  . Deaf   . Injury of flexor tendon of hand 12/2013   right ring and small fingers  . Postoperative nausea and vomiting     Patient Active Problem List   Diagnosis Date Noted  . Deaf 11/01/2011    Past Surgical History:  Procedure Laterality Date  . BUNIONECTOMY Left   . CHOLECYSTECTOMY  11/01/2011   Procedure: LAPAROSCOPIC CHOLECYSTECTOMY;  Surgeon: Atilano Ina, MD;  Location: [facility];  Service: General;  Laterality: N/A;  with intraoperative cholangiogram  . HAND SURGERY    . TENDON REPAIR Right 12/29/2013   Procedure: FLEXOR TENDON REPAIR  RIGHT RING FINGER AND RIGHT SMALL FINGER;  Surgeon: Knute Neu, MD;  Location: [facility];  Service: Plastics;  Laterality: Right;  . TONSILLECTOMY AND ADENOIDECTOMY  2001  . WISDOM TOOTH EXTRACTION  03/26/2011    OB History    No data available       Home Medications    Prior to Admission medications    Medication Sig Start Date End Date Taking? Authorizing Provider  celecoxib (CELEBREX) 200 MG capsule Take 200 mg by mouth daily.    Historical Provider, MD  cephALEXin (KEFLEX) 500 MG capsule Take 500 mg by mouth 4 (four) times daily.    Historical Provider, MD  HYDROcodone-acetaminophen (NORCO/VICODIN) 5-325 MG tablet Take 1-2 tablets by mouth every 6 hours as needed for pain and/or cough. 06/17/16   Nicole Pisciotta, PA-C  meloxicam (MOBIC) 7.5 MG tablet Take 7.5 mg by mouth daily.    Historical Provider, MD  Multiple Vitamin (MULTIVITAMIN) tablet Take 1 tablet by mouth daily.    Historical Provider, MD  omeprazole (PRILOSEC) 20 MG capsule Take 20 mg by mouth daily.    Historical Provider, MD  oxyCODONE-acetaminophen (PERCOCET/ROXICET) 5-325 MG per tablet Take by mouth every 4 (four) hours as needed for severe pain.    Historical Provider, MD  promethazine (PHENERGAN) 50 MG tablet Take 50 mg by mouth every 6 (six) hours as needed for nausea or vomiting.    Historical Provider, MD  traZODone (DESYREL) 100 MG tablet Take 100 mg by mouth at bedtime.    Historical Provider, MD    Family History Family History  Problem Relation Age of Onset  . Hypertension Mother     Social History Social History  Substance Use Topics  . Smoking status: Current Some Day Smoker    Years: 5.00    Types: Cigarettes  . Smokeless tobacco: Never  Used     Comment: 2-3 cig./day  . Alcohol use 0.0 oz/week     Allergies   Review of patient's allergies indicates no known allergies.   Review of Systems Review of Systems  Constitutional: Negative for chills and fever.  Skin: Positive for wound. Negative for rash.  Neurological: Negative for numbness.     Physical Exam Updated Vital Signs BP 101/67 (BP Location: Left Arm)   Pulse 60   Temp 98.1 F (36.7 C) (Oral)   Resp 18   LMP 06/03/2016   SpO2 100%   Physical Exam  Constitutional: She is oriented to person, place, and time. She appears  well-developed and well-nourished. No distress.  HENT:  Head: Normocephalic and atraumatic.  Eyes: Conjunctivae and EOM are normal.  Neck: Neck supple. No tracheal deviation present.  Cardiovascular: Normal rate.   Pulmonary/Chest: Effort normal. No respiratory distress.  Musculoskeletal: Normal range of motion.  Neurological: She is alert and oriented to person, place, and time.  Skin: Skin is warm and dry.  Psychiatric: She has a normal mood and affect. Her behavior is normal.  Nursing note and vitals reviewed.    ED Treatments / Results  DIAGNOSTIC STUDIES: Oxygen Saturation is 100% on RA, normal by my interpretation.    COORDINATION OF CARE: 8:13 PM- Pt advised of plan for treatment and pt agrees. Pt will receive laceration repair with lidocaine 2%.    Procedures .Marland Kitchen.Laceration Repair Date/Time: 06/26/2016 7:45 PM Performed by: Arthor CaptainHARRIS, Curtis Cain Authorized by: Arthor CaptainHARRIS, Oda Lansdowne   Consent:    Consent obtained:  Verbal   Consent given by:  Patient   Risks discussed:  Pain Anesthesia (see MAR for exact dosages):    Anesthesia method:  Local infiltration   Local anesthetic:  Lidocaine 2% w/o epi Laceration details:    Location:  Finger   Finger location:  R index finger Treatment:    Area cleansed with:  Betadine   Amount of cleaning:  Standard Skin repair:    Repair method:  Sutures   Suture size:  5-0   Suture material:  Prolene   Suture technique:  Simple interrupted   Number of sutures:  6 Post-procedure details:    Dressing:  Sterile dressing   Patient tolerance of procedure:  Tolerated well, no immediate complications     (including critical care time)  Medications Ordered in ED Medications - No data to display   Initial Impression / Assessment and Plan / ED Course  Arthor CaptainAbigail Gabrelle Roca, PA-C has reviewed the triage vital signs and the nursing notes.  Pertinent labs & imaging results that were available during my care of the patient were reviewed by me and  considered in my medical decision making (see chart for details).  Clinical Course    Pressure irrigation performed. Laceration occurred < 8 hours prior to repair which was well tolerated. Pt has no co morbidities to effect normal wound healing. Discussed suture home care w pt and answered questions. Pt to f-u for wound check and suture removal in 7 days. Pt is hemodynamically stable w no complaints prior to dc.     Final Clinical Impressions(s) / ED Diagnoses   Final diagnoses:  Laceration    New Prescriptions New Prescriptions   No medications on file   I personally performed the services described in this documentation, which was scribed in my presence. The recorded information has been reviewed and is accurate.        Arthor CaptainAbigail Vernelle Wisner, PA-C 06/26/16 2304  Tomasita Crumble, MD 06/27/16 2158

## 2016-06-26 NOTE — Discharge Instructions (Signed)
WOUND CARE °Please return in 8 days to have your °stitches/staples removed or sooner if you have concerns. ° Keep area clean and dry for 24 hours. Do not remove °bandage, if applied. ° After 24 hours, remove bandage and wash wound °gently with mild soap and warm water. Reapply °a new bandage after cleaning wound, if directed. ° Continue daily cleansing with soap and water until °stitches/staples are removed. ° Do not apply any ointments or creams to the wound °while stitches/staples are in place, as this may cause °delayed healing. ° Notify the office if you experience any of the following °signs of infection: Swelling, redness, pus drainage, °streaking, fever >101.0 F ° Notify the office if you experience excessive bleeding °that does not stop after 15-20 minutes of constant, firm °pressure. ° °

## 2016-06-26 NOTE — ED Triage Notes (Signed)
Pt states she cut her right hand today on broken glass while washing dishes. Pt has laceration to base of right index finger. Pt is deaf, [employer] video sign language interpreter used

## 2016-06-26 NOTE — ED Notes (Signed)
PT DISCHARGED. INSTRUCTIONS GIVEN. AAOX4. PT IN NO APPARENT DISTRESS OR PAIN. THE OPPORTUNITY TO ASK QUESTIONS WAS PROVIDED. 

## 2016-07-05 ENCOUNTER — Emergency Department (HOSPITAL_COMMUNITY)
Admission: EM | Admit: 2016-07-05 | Discharge: 2016-07-05 | Disposition: A | Discharge status: Home / Self Care | Payer: Medicare Other | Attending: Emergency Medicine | Admitting: Emergency Medicine

## 2016-07-05 ENCOUNTER — Encounter: Payer: Medicaid Other | Admitting: Obstetrics and Gynecology

## 2016-07-05 ENCOUNTER — Encounter (HOSPITAL_COMMUNITY): Payer: Self-pay | Admitting: *Deleted

## 2016-07-05 DIAGNOSIS — Z4802 Encounter for removal of sutures: Secondary | ICD-10-CM | POA: Insufficient documentation

## 2016-07-05 DIAGNOSIS — Z79899 Other long term (current) drug therapy: Secondary | ICD-10-CM | POA: Diagnosis not present

## 2016-07-05 DIAGNOSIS — F17210 Nicotine dependence, cigarettes, uncomplicated: Secondary | ICD-10-CM | POA: Insufficient documentation

## 2016-07-05 NOTE — Discharge Instructions (Signed)
Continue to keep area clean.  Return to ER for new or worsening symptoms, any additional concerns.

## 2016-07-05 NOTE — ED Provider Notes (Signed)
[facility]ER[facility]PT Provider Note   CSN: [id number] Arrival date & time: 07/05/16  1114     History   Chief Complaint Chief Complaint  Patient presents with  . Suture / Staple Removal    HPI Patricia Sanchez is a 25 y.o. female.  The history is provided by the patient and medical records. No language interpreter was used.  Suture / Staple Removal    Suture Removal: Patient here for suture removal. she obtained a laceration left hand 10 days ago. . She had 5 sutures. Mechanism of injury: Washing dishes and broken cup cut finger. Denies redness, draining, or swelling. Healing well per patient.  Past Medical History:  Diagnosis Date  . Deaf   . Injury of flexor tendon of hand 12/2013   right ring and small fingers  . Postoperative nausea and vomiting     Patient Active Problem List   Diagnosis Date Noted  . Deaf 11/01/2011    Past Surgical History:  Procedure Laterality Date  . BUNIONECTOMY Left   . CHOLECYSTECTOMY  11/01/2011   Procedure: LAPAROSCOPIC CHOLECYSTECTOMY;  Surgeon: Atilano Ina, MD;  Location: [facility];  Service: General;  Laterality: N/A;  with intraoperative cholangiogram  . HAND SURGERY    . TENDON REPAIR Right 12/29/2013   Procedure: FLEXOR TENDON REPAIR  RIGHT RING FINGER AND RIGHT SMALL FINGER;  Surgeon: Knute Neu, MD;  Location: [facility];  Service: Plastics;  Laterality: Right;  . TONSILLECTOMY AND ADENOIDECTOMY  2001  . WISDOM TOOTH EXTRACTION  03/26/2011    OB History    No data available       Home Medications    Prior to Admission medications   Medication Sig Start Date End Date Taking? Authorizing Provider  celecoxib (CELEBREX) 200 MG capsule Take 200 mg by mouth daily.    Historical Provider, MD  cephALEXin (KEFLEX) 500 MG capsule Take 500 mg by mouth 4 (four) times daily.    Historical Provider, MD  HYDROcodone-acetaminophen (NORCO/VICODIN) 5-325 MG tablet Take 1-2 tablets by mouth every 6 hours as needed for  pain and/or cough. 06/17/16   Nicole Pisciotta, PA-C  meloxicam (MOBIC) 7.5 MG tablet Take 7.5 mg by mouth daily.    Historical Provider, MD  Multiple Vitamin (MULTIVITAMIN) tablet Take 1 tablet by mouth daily.    Historical Provider, MD  omeprazole (PRILOSEC) 20 MG capsule Take 20 mg by mouth daily.    Historical Provider, MD  oxyCODONE-acetaminophen (PERCOCET/ROXICET) 5-325 MG per tablet Take by mouth every 4 (four) hours as needed for severe pain.    Historical Provider, MD  promethazine (PHENERGAN) 50 MG tablet Take 50 mg by mouth every 6 (six) hours as needed for nausea or vomiting.    Historical Provider, MD  traZODone (DESYREL) 100 MG tablet Take 100 mg by mouth at bedtime.    Historical Provider, MD    Family History Family History  Problem Relation Age of Onset  . Hypertension Mother     Social History Social History  Substance Use Topics  . Smoking status: Current Some Day Smoker    Years: 5.00    Types: Cigarettes  . Smokeless tobacco: Never Used     Comment: 2-3 cig./day  . Alcohol use 0.0 oz/week     Allergies   Review of patient's allergies indicates no known allergies.   Review of Systems Review of Systems  Skin: Positive for wound. Negative for color change.     Physical Exam Updated Vital Signs BP 144/95 (  BP Location: Left Arm)   Pulse 60   Temp 98.3 F (36.8 C) (Oral)   Resp 18   LMP 06/03/2016   SpO2 100%   Physical Exam  Constitutional: She appears well-developed and well-nourished.  Neck: Neck supple.  Cardiovascular: Normal rate and regular rhythm.   Pulmonary/Chest: Effort normal and breath sounds normal.  Musculoskeletal: Normal range of motion.  Neurological: She is alert.  Skin: Skin is warm and dry.  Well healing laceration to left hand with 5 sutures in place. No erythema or drainage. Nontender.  Psychiatric: She has a normal mood and affect.  Nursing note and vitals reviewed.    ED Treatments / Results  Labs (all labs ordered  are listed, but only abnormal results are displayed) Labs Reviewed - No data to display  EKG  EKG Interpretation None       Radiology No results found.  Procedures .Suture Removal Date/Time: 07/05/2016 11:34 AM Performed by: Janyth ContesWARD, Keyna Blizard PILCHER Authorized by: Janyth ContesWARD, Lind Ausley PILCHER   Consent:    Consent obtained:  Verbal   Consent given by:  Patient   Risks discussed:  Bleeding, pain and wound separation Location:    Location:  Upper extremity   Upper extremity location:  Hand   Hand location:  L hand Procedure details:    Wound appearance:  Good wound healing   Number of sutures removed:  5 Post-procedure details:    Post-removal:  Dressing applied   Patient tolerance of procedure:  Tolerated well, no immediate complications   (including critical care time)  Medications Ordered in ED Medications - No data to display   Initial Impression / Assessment and Plan / ED Course  I have reviewed the triage vital signs and the nursing notes.  Pertinent labs & imaging results that were available during my care of the patient were reviewed by me and considered in my medical decision making (see chart for details).  Clinical Course   Patricia Sanchez presents to ED for suture removal. Wound appears well healing with no signs of infection. Sutures were removed as dictated above. Home wound care discussed and all questions answered  Final Clinical Impressions(s) / ED Diagnoses   Final diagnoses:  Visit for suture removal    New Prescriptions New Prescriptions   No medications on file     [facility] ClinicJaime Pilcher Calix Heinbaugh, PA-C 07/05/16 1135    Lyndal Pulleyaniel Knott, MD 07/06/16 1133

## 2016-07-05 NOTE — ED Triage Notes (Signed)
Pt here for suture removal, 5 sutures to rt index finger at base

## 2016-07-05 NOTE — ED Notes (Signed)
Bed: WTR5 Expected date:  Expected time:  Means of arrival:  Comments: 

## 2016-07-06 ENCOUNTER — Encounter: Payer: Self-pay | Admitting: Obstetrics and Gynecology

## 2016-07-06 NOTE — Progress Notes (Signed)
Patient did not show for 07/05/2016 GYN referral visit.  Cornelia Copaharlie Mckenzie Bove, Jr MD Attending [facility](Faculty Practice)

## 2016-09-12 ENCOUNTER — Emergency Department (HOSPITAL_COMMUNITY)
Admission: EM | Admit: 2016-09-12 | Discharge: 2016-09-12 | Disposition: A | Discharge status: Home / Self Care | Payer: Medicare Other | Attending: Emergency Medicine | Admitting: Emergency Medicine

## 2016-09-12 ENCOUNTER — Encounter (HOSPITAL_COMMUNITY): Payer: Self-pay | Admitting: Emergency Medicine

## 2016-09-12 DIAGNOSIS — H60501 Unspecified acute noninfective otitis externa, right ear: Secondary | ICD-10-CM | POA: Diagnosis not present

## 2016-09-12 DIAGNOSIS — H9201 Otalgia, right ear: Secondary | ICD-10-CM | POA: Diagnosis present

## 2016-09-12 DIAGNOSIS — F17210 Nicotine dependence, cigarettes, uncomplicated: Secondary | ICD-10-CM | POA: Insufficient documentation

## 2016-09-12 MED ORDER — NEOMYCIN-POLYMYXIN-HC 3.5-10000-1 OT SUSP: 4.0000 [drp] | Freq: Four times a day (QID) | OTIC | 0 refills | Status: AC

## 2016-09-12 NOTE — Discharge Instructions (Signed)
Please read and follow all provided instructions.  Your diagnoses today include:  1. Acute otitis externa of right ear, unspecified type     Tests performed today include: Vital signs. See below for your results today.   Medications prescribed:  Take as prescribed   Home care instructions:  Follow any educational materials contained in this packet.  Follow-up instructions: Please follow-up with your primary care provider for further evaluation of symptoms and treatment   Return instructions:  Please return to the Emergency Department if you do not get better, if you get worse, or new symptoms OR  - Fever (temperature greater than 101.8F)  - Bleeding that does not stop with holding pressure to the area    -Severe pain (please note that you may be more sore the day after your accident)  - Chest Pain  - Difficulty breathing  - Severe nausea or vomiting  - Inability to tolerate food and liquids  - Passing out  - Skin becoming red around your wounds  - Change in mental status (confusion or lethargy)  - New numbness or weakness    Please return if you have any other emergent concerns.  Additional Information:  Your vital signs today were: BP 125/75 (BP Location: Left Arm)    Pulse 86    Temp 98.1 F (36.7 C) (Oral)    Resp 18    Ht 5\' 11"  (1.803 m)    Wt 54.9 kg    SpO2 96%    BMI 16.88 kg/m  If your blood pressure (BP) was elevated above 135/85 this visit, please have this repeated by your doctor within one month. --------------

## 2016-09-12 NOTE — ED Triage Notes (Signed)
Pt reports productive cough, sore throat, and right ear pain x2 days. Denies SOB, chest pain, and abdominal pain.

## 2016-09-12 NOTE — ED Provider Notes (Signed)
[facility] Provider Note   CSN: [id number] Arrival date & time: 09/12/16  1123  By signing my name below, I, Arianna Nassar, attest that this documentation has been prepared under the direction and in the presence of Audry Piliyler Tevan Marian, PA-C.  Electronically Signed: Octavia HeirArianna Nassar, ED Scribe. 09/12/16. 12:10 PM.    History   Chief Complaint Chief Complaint  Patient presents with  . Cough  . Sore Throat  . Otalgia     The history is provided by the patient. A language interpreter was used.   HPI Comments: Patricia Sanchez is a 25 y.o. female who is deaf presents to the Emergency Department complaining of sudden onset, moderate right ear pain x 2 days. She has been having associated productive cough and sore throat. Pt notes having a subjective fever last night but states it has since been relieved. Pt has not taken any medication to alleviate her symptoms. She states has not been able to go to work for the past two days because of her symptoms. There are no other complaints.   Past Medical History:  Diagnosis Date  . Deaf   . Injury of flexor tendon of hand 12/2013   right ring and small fingers  . Postoperative nausea and vomiting     Patient Active Problem List   Diagnosis Date Noted  . Deaf 11/01/2011    Past Surgical History:  Procedure Laterality Date  . BUNIONECTOMY Left   . CHOLECYSTECTOMY  11/01/2011   Procedure: LAPAROSCOPIC CHOLECYSTECTOMY;  Surgeon: Atilano InaEric M Wilson, MD;  Location: [facility];  Service: General;  Laterality: N/A;  with intraoperative cholangiogram  . HAND SURGERY    . TENDON REPAIR Right 12/29/2013   Procedure: FLEXOR TENDON REPAIR  RIGHT RING FINGER AND RIGHT SMALL FINGER;  Surgeon: Knute NeuHarrill Coley, MD;  Location: [facility];  Service: Plastics;  Laterality: Right;  . TONSILLECTOMY AND ADENOIDECTOMY  2001  . WISDOM TOOTH EXTRACTION  03/26/2011    OB History    No data available       Home Medications    Prior to Admission  medications   Medication Sig Start Date End Date Taking? Authorizing Provider  celecoxib (CELEBREX) 200 MG capsule Take 200 mg by mouth daily.    Historical Provider, MD  cephALEXin (KEFLEX) 500 MG capsule Take 500 mg by mouth 4 (four) times daily.    Historical Provider, MD  HYDROcodone-acetaminophen (NORCO/VICODIN) 5-325 MG tablet Take 1-2 tablets by mouth every 6 hours as needed for pain and/or cough. 06/17/16   Nicole Pisciotta, PA-C  meloxicam (MOBIC) 7.5 MG tablet Take 7.5 mg by mouth daily.    Historical Provider, MD  Multiple Vitamin (MULTIVITAMIN) tablet Take 1 tablet by mouth daily.    Historical Provider, MD  omeprazole (PRILOSEC) 20 MG capsule Take 20 mg by mouth daily.    Historical Provider, MD  oxyCODONE-acetaminophen (PERCOCET/ROXICET) 5-325 MG per tablet Take by mouth every 4 (four) hours as needed for severe pain.    Historical Provider, MD  promethazine (PHENERGAN) 50 MG tablet Take 50 mg by mouth every 6 (six) hours as needed for nausea or vomiting.    Historical Provider, MD  traZODone (DESYREL) 100 MG tablet Take 100 mg by mouth at bedtime.    Historical Provider, MD    Family History Family History  Problem Relation Age of Onset  . Hypertension Mother     Social History Social History  Substance Use Topics  . Smoking status: Current Some Day Smoker  Years: 5.00    Types: Cigarettes  . Smokeless tobacco: Never Used     Comment: 2-3 cig./day  . Alcohol use 0.0 oz/week     Allergies   Patient has no known allergies.   Review of Systems Review of Systems  Constitutional: Negative for fever.  HENT: Positive for ear pain and sore throat.   Respiratory: Positive for cough.    Physical Exam Updated Vital Signs BP 125/75 (BP Location: Left Arm)   Pulse 86   Temp 98.1 F (36.7 C) (Oral)   Resp 18   SpO2 96%   Physical Exam  Constitutional: She is oriented to person, place, and time. Vital signs are normal. She appears well-developed and well-nourished.   HENT:  Head: Normocephalic.  Right Ear: Hearing normal.  Left Ear: Hearing normal.  Right ear has erythema with swelling, tragus TTP, no mastoid tenderness Throat has no erythema or exudate  Eyes: Conjunctivae and EOM are normal. Pupils are equal, round, and reactive to light.  Neck: Normal range of motion.  Full ROM of neck  Cardiovascular: Normal rate and regular rhythm.   Pulmonary/Chest: Effort normal.  Abdominal: She exhibits no distension.  Musculoskeletal: Normal range of motion.  Neurological: She is alert and oriented to person, place, and time.  Skin: Skin is warm and dry.  Psychiatric: She has a normal mood and affect. Her speech is normal and behavior is normal. Thought content normal.  Nursing note and vitals reviewed.    ED Treatments / Results  DIAGNOSTIC STUDIES: Oxygen Saturation is 96% on RA, adequate by my interpretation.  COORDINATION OF CARE:  12:07 PM Discussed treatment plan which includes follow up with PCP with pt at bedside and pt agreed to plan.  Labs (all labs ordered are listed, but only abnormal results are displayed) Labs Reviewed - No data to display  EKG  EKG Interpretation None       Radiology No results found.  Procedures Procedures (including critical care time)  Medications Ordered in ED Medications - No data to display   Initial Impression / Assessment and Plan / ED Course  I have reviewed the triage vital signs and the nursing notes.  Pertinent labs & imaging results that were available during my care of the patient were reviewed by me and considered in my medical decision making (see chart for details).  Clinical Course    Final Clinical Impressions(s) / ED Diagnoses    I have reviewed the relevant previous healthcare records. I obtained HPI from historian.   ED Course:  Assessment: Pt presenting with otitis externa. Pt afebrile in NAD. Exam not concerning for mastoiditis, cellulitis or malignant OE. Discharge with  ABX drops.  Advised follow up with PCP in 2-3 days if no improvement.  Return precautions discussed. Pt appears safe for discharge. Pt is comfortable with above plan and is stable for discharge at this time. All questions were answered prior to disposition.   Disposition/Plan:  DC Home Additional Verbal discharge instructions given and discussed with patient.  Pt Instructed to f/u with PCP in the next week for evaluation and treatment of symptoms. Return precautions given Pt acknowledges and agrees with plan  Supervising Physician Shaune Pollackameron Isaacs, MD  Final diagnoses:  Acute otitis externa of right ear, unspecified type   New Prescriptions New Prescriptions   No medications on file     Audry Piliyler Crosby Bevan, PA-C 09/12/16 1212    Shaune Pollackameron Isaacs, MD 09/13/16 1902

## 2016-09-12 NOTE — ED Notes (Signed)
ED Provider at bedside. 

## 2016-09-17 ENCOUNTER — Encounter (HOSPITAL_COMMUNITY): Payer: Self-pay | Admitting: Emergency Medicine

## 2016-09-17 ENCOUNTER — Emergency Department (HOSPITAL_COMMUNITY)
Admission: EM | Admit: 2016-09-17 | Discharge: 2016-09-17 | Disposition: A | Discharge status: Home / Self Care | Payer: Medicare Other | Attending: Emergency Medicine | Admitting: Emergency Medicine

## 2016-09-17 ENCOUNTER — Emergency Department (HOSPITAL_COMMUNITY): Payer: Medicare Other

## 2016-09-17 DIAGNOSIS — M25561 Pain in right knee: Secondary | ICD-10-CM | POA: Insufficient documentation

## 2016-09-17 DIAGNOSIS — F17210 Nicotine dependence, cigarettes, uncomplicated: Secondary | ICD-10-CM | POA: Diagnosis not present

## 2016-09-17 DIAGNOSIS — Z79899 Other long term (current) drug therapy: Secondary | ICD-10-CM | POA: Insufficient documentation

## 2016-09-17 NOTE — ED Notes (Addendum)
Ortho Tech paged to apply knee sleeve.

## 2016-09-17 NOTE — ED Triage Notes (Signed)
Pt works for [employer] and has been complaining of atraumatic rt knee pain x 1 wk. Swelling noted.

## 2016-09-17 NOTE — ED Provider Notes (Signed)
[facility]-EMERGENCY DEPT Provider Note   CSN: [id number] Arrival date & time: 09/17/16  1010     History   Chief Complaint Chief Complaint  Patient presents with  . Knee Pain    HPI Patricia Sanchez is a 25 y.o. female.  Patient presents with pain to her right knee. She states it's been hurting for about the last week. She states it hurts from time to time, mostly when she is up walking on it more. She states she's been walking on it more recently. She states it started after she played basketball when she was younger. She denies any recent injuries to the knee. She takes ibuprofen at times with some improvement in symptoms. She is not seen orthopedist about her knee.      Past Medical History:  Diagnosis Date  . Deaf   . Injury of flexor tendon of hand 12/2013   right ring and small fingers  . Postoperative nausea and vomiting     Patient Active Problem List   Diagnosis Date Noted  . Deaf 11/01/2011    Past Surgical History:  Procedure Laterality Date  . BUNIONECTOMY Left   . CHOLECYSTECTOMY  11/01/2011   Procedure: LAPAROSCOPIC CHOLECYSTECTOMY;  Surgeon: Atilano InaEric M Wilson, MD;  Location: [facility];  Service: General;  Laterality: N/A;  with intraoperative cholangiogram  . HAND SURGERY    . TENDON REPAIR Right 12/29/2013   Procedure: FLEXOR TENDON REPAIR  RIGHT RING FINGER AND RIGHT SMALL FINGER;  Surgeon: Knute NeuHarrill Coley, MD;  Location: [facility];  Service: Plastics;  Laterality: Right;  . TONSILLECTOMY AND ADENOIDECTOMY  2001  . WISDOM TOOTH EXTRACTION  03/26/2011    OB History    No data available       Home Medications    Prior to Admission medications   Medication Sig Start Date End Date Taking? Authorizing Provider  celecoxib (CELEBREX) 200 MG capsule Take 200 mg by mouth daily.    Historical Provider, MD  cephALEXin (KEFLEX) 500 MG capsule Take 500 mg by mouth 4 (four) times daily.    Historical Provider, MD  HYDROcodone-acetaminophen  (NORCO/VICODIN) 5-325 MG tablet Take 1-2 tablets by mouth every 6 hours as needed for pain and/or cough. 06/17/16   Nicole Pisciotta, PA-C  meloxicam (MOBIC) 7.5 MG tablet Take 7.5 mg by mouth daily.    Historical Provider, MD  Multiple Vitamin (MULTIVITAMIN) tablet Take 1 tablet by mouth daily.    Historical Provider, MD  neomycin-polymyxin-hydrocortisone (CORTISPORIN) 3.5-10000-1 otic suspension Place 4 drops into the right ear 4 (four) times daily. For 7 days 09/12/16 09/19/16  Audry Piliyler Mohr, PA-C  omeprazole (PRILOSEC) 20 MG capsule Take 20 mg by mouth daily.    Historical Provider, MD  oxyCODONE-acetaminophen (PERCOCET/ROXICET) 5-325 MG per tablet Take by mouth every 4 (four) hours as needed for severe pain.    Historical Provider, MD  promethazine (PHENERGAN) 50 MG tablet Take 50 mg by mouth every 6 (six) hours as needed for nausea or vomiting.    Historical Provider, MD  traZODone (DESYREL) 100 MG tablet Take 100 mg by mouth at bedtime.    Historical Provider, MD    Family History Family History  Problem Relation Age of Onset  . Hypertension Mother     Social History Social History  Substance Use Topics  . Smoking status: Current Some Day Smoker    Years: 5.00    Types: Cigarettes  . Smokeless tobacco: Never Used     Comment: 2-3 cig./day  .  Alcohol use 0.0 oz/week     Allergies   Patient has no known allergies.   Review of Systems Review of Systems  Constitutional: Negative for fever.  Gastrointestinal: Negative for nausea and vomiting.  Musculoskeletal: Positive for arthralgias and joint swelling. Negative for back pain and neck pain.  Skin: Negative for wound.  Neurological: Negative for weakness, numbness and headaches.     Physical Exam Updated Vital Signs BP 128/79 (BP Location: Left Arm)   Pulse 61   Temp 98 F (36.7 C) (Oral)   Resp 16   LMP 09/17/2016 (Exact Date)   SpO2 100%   Physical Exam  Constitutional: She is oriented to person, place, and time.  She appears well-developed and well-nourished.  HENT:  Head: Normocephalic and atraumatic.  Neck: Normal range of motion. Neck supple.  Cardiovascular: Normal rate.   Pulmonary/Chest: Effort normal.  Musculoskeletal: She exhibits edema and tenderness.  Patient has very mild swelling to her right knee. There is no warmth or erythema. She has pain primarily over the patellar tendon. There is no ligament instability. She's able do a straight leg raise. She's neurovascularly intact distally. There is no pain to the ankle or hip.  Neurological: She is alert and oriented to person, place, and time.  Skin: Skin is warm and dry.  Psychiatric: She has a normal mood and affect.     ED Treatments / Results  Labs (all labs ordered are listed, but only abnormal results are displayed) Labs Reviewed - No data to display  EKG  EKG Interpretation None       Radiology Dg Knee Complete 4 Views Right  Result Date: 09/17/2016 CLINICAL DATA:  25 year old female with a history of right knee pain for week. EXAM: RIGHT KNEE - COMPLETE 4+ VIEW COMPARISON:  06/07/2015 FINDINGS: No evidence of fracture, dislocation, or joint effusion. No evidence of arthropathy or other focal bone abnormality. Soft tissues are unremarkable. IMPRESSION: Negative for acute bony abnormality. Signed, Yvone NeuJaime S. Loreta AveWagner, DO [facility] Electronically Signed   By: Gilmer MorJaime  Wagner D.O.   On: 09/17/2016 11:22    Procedures Procedures (including critical care time)  Medications Ordered in ED Medications - No data to display   Initial Impression / Assessment and Plan / ED Course  I have reviewed the triage vital signs and the nursing notes.  Pertinent labs & imaging results that were available during my care of the patient were reviewed by me and considered in my medical decision making (see chart for details).  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 A knee sleeve was placed. She was advised in ice and elevation. She was advised to use ibuprofen for symptomatically. She was given a referral to follow-up with orthopedics if her symptoms are not improving.  Final Clinical Impressions(s) / ED Diagnoses   Final diagnoses:  Acute pain of right knee    New Prescriptions New Prescriptions   No medications on file     Rolan BuccoMelanie Berlin Viereck, MD 09/17/16 1151

## 2016-09-17 NOTE — ED Notes (Signed)
Deaf interpreter called, pt does not want the deaf interpreter and prefers written communication.

## 2016-09-17 NOTE — ED Notes (Signed)
Pt given instructions on how to use the crutches and was able to demonstrate correct usage.  Pt ambulated out of department without any issues.

## 2016-09-17 NOTE — ED Notes (Signed)
Patient transported to X-ray 

## 2016-11-05 ENCOUNTER — Emergency Department (HOSPITAL_COMMUNITY): Payer: Medicare Other

## 2016-11-05 ENCOUNTER — Encounter (HOSPITAL_COMMUNITY): Payer: Self-pay | Admitting: Emergency Medicine

## 2016-11-05 ENCOUNTER — Emergency Department (HOSPITAL_COMMUNITY)
Admission: EM | Admit: 2016-11-05 | Discharge: 2016-11-05 | Disposition: A | Discharge status: Home / Self Care | Payer: Medicare Other | Attending: Emergency Medicine | Admitting: Emergency Medicine

## 2016-11-05 DIAGNOSIS — Y999 Unspecified external cause status: Secondary | ICD-10-CM | POA: Insufficient documentation

## 2016-11-05 DIAGNOSIS — S63601A Unspecified sprain of right thumb, initial encounter: Secondary | ICD-10-CM | POA: Insufficient documentation

## 2016-11-05 DIAGNOSIS — Y929 Unspecified place or not applicable: Secondary | ICD-10-CM | POA: Diagnosis not present

## 2016-11-05 DIAGNOSIS — W2201XA Walked into wall, initial encounter: Secondary | ICD-10-CM | POA: Insufficient documentation

## 2016-11-05 DIAGNOSIS — S6991XA Unspecified injury of right wrist, hand and finger(s), initial encounter: Secondary | ICD-10-CM | POA: Diagnosis present

## 2016-11-05 DIAGNOSIS — Y939 Activity, unspecified: Secondary | ICD-10-CM | POA: Insufficient documentation

## 2016-11-05 DIAGNOSIS — F17210 Nicotine dependence, cigarettes, uncomplicated: Secondary | ICD-10-CM | POA: Insufficient documentation

## 2016-11-05 MED ORDER — IBUPROFEN 800 MG PO TABS
800.0000 mg | ORAL_TABLET | Freq: Once | ORAL | Status: AC
  Administered 2016-11-05: 800 mg via ORAL
  Filled 2016-11-05: qty 1

## 2016-11-05 NOTE — ED Provider Notes (Signed)
[facility]PT Provider Note   CSN: [id number] Arrival date & time: 11/05/16  1313  By signing my name below, I, Patricia Sanchez, attest that this documentation has been prepared under the direction and in the presence of Langston MaskerKaren Custer Pimenta, [place]PA-C. Electronically Signed: Teofilo PodMatthew P. Sanchez, ED Scribe. 11/05/2016. 1:31 PM.    History   Chief Complaint No chief complaint on file.   The history is provided by the patient. The history is limited by a language barrier (deaf). No language interpreter was used.   HPI Comments:  Patricia Sanchez is a 26 y.o. female who presents to the Emergency Department s/p right thumb injury that occurred PTA. Pt reports that she hit her right thumb against the wall, and is complaining of pain and swelling to the right thumb. No alleviating factors noted. Pt denies other associated symptoms.   Past Medical History:  Diagnosis Date  . Deaf   . Injury of flexor tendon of hand 12/2013   right ring and small fingers  . Postoperative nausea and vomiting     Patient Active Problem List   Diagnosis Date Noted  . Deaf 11/01/2011    Past Surgical History:  Procedure Laterality Date  . BUNIONECTOMY Left   . CHOLECYSTECTOMY  11/01/2011   Procedure: LAPAROSCOPIC CHOLECYSTECTOMY;  Surgeon: Atilano InaEric M Wilson, MD;  Location: [facility];  Service: General;  Laterality: N/A;  with intraoperative cholangiogram  . HAND SURGERY    . TENDON REPAIR Right 12/29/2013   Procedure: FLEXOR TENDON REPAIR  RIGHT RING FINGER AND RIGHT SMALL FINGER;  Surgeon: Knute NeuHarrill Coley, MD;  Location: [facility];  Service: Plastics;  Laterality: Right;  . TONSILLECTOMY AND ADENOIDECTOMY  2001  . WISDOM TOOTH EXTRACTION  03/26/2011    OB History    No data available       Home Medications    Prior to Admission medications   Medication Sig Start Date End Date Taking? Authorizing Provider  celecoxib (CELEBREX) 200 MG capsule Take 200 mg by mouth daily.    Historical  Provider, MD  cephALEXin (KEFLEX) 500 MG capsule Take 500 mg by mouth 4 (four) times daily.    Historical Provider, MD  HYDROcodone-acetaminophen (NORCO/VICODIN) 5-325 MG tablet Take 1-2 tablets by mouth every 6 hours as needed for pain and/or cough. 06/17/16   Nicole Pisciotta, PA-C  meloxicam (MOBIC) 7.5 MG tablet Take 7.5 mg by mouth daily.    Historical Provider, MD  Multiple Vitamin (MULTIVITAMIN) tablet Take 1 tablet by mouth daily.    Historical Provider, MD  omeprazole (PRILOSEC) 20 MG capsule Take 20 mg by mouth daily.    Historical Provider, MD  oxyCODONE-acetaminophen (PERCOCET/ROXICET) 5-325 MG per tablet Take by mouth every 4 (four) hours as needed for severe pain.    Historical Provider, MD  promethazine (PHENERGAN) 50 MG tablet Take 50 mg by mouth every 6 (six) hours as needed for nausea or vomiting.    Historical Provider, MD  traZODone (DESYREL) 100 MG tablet Take 100 mg by mouth at bedtime.    Historical Provider, MD    Family History Family History  Problem Relation Age of Onset  . Hypertension Mother     Social History Social History  Substance Use Topics  . Smoking status: Current Some Day Smoker    Years: 5.00    Types: Cigarettes  . Smokeless tobacco: Never Used     Comment: 2-3 cig./day  . Alcohol use 0.0 oz/week     Allergies   Patient  has no known allergies.   Review of Systems Review of Systems  Musculoskeletal: Positive for arthralgias and joint swelling.  Neurological: Negative for numbness.     Physical Exam Updated Vital Signs BP 132/81 (BP Location: Right Arm)   Pulse 68   Temp 97.9 F (36.6 C) (Oral)   Resp 18   Physical Exam  Constitutional: She appears well-developed and well-nourished. No distress.  HENT:  Head: Normocephalic and atraumatic.  Eyes: Conjunctivae are normal.  Cardiovascular: Normal rate.   Pulmonary/Chest: Effort normal.  Abdominal: She exhibits no distension.  Neurological: She is alert.  Skin: Skin is warm  and dry.  Psychiatric: She has a normal mood and affect.  Nursing note and vitals reviewed.    ED Treatments / Results  DIAGNOSTIC STUDIES:  Oxygen Saturation is 100% on RA, normal by my interpretation.    COORDINATION OF CARE:  1:32 PM Discussed treatment plan with pt at bedside and pt agreed to plan.    Labs (all labs ordered are listed, but only abnormal results are displayed) Labs Reviewed - No data to display  EKG  EKG Interpretation None       Radiology No results found.  Procedures Procedures (including critical care time)  Medications Ordered in ED Medications - No data to display   Initial Impression / Assessment and Plan / ED Course  I have reviewed the triage vital signs and the nursing notes.  Pertinent labs & imaging results that were available during my care of the patient were reviewed by me and considered in my medical decision making (see chart for details).  Clinical Course       Final Clinical Impressions(s) / ED Diagnoses   Final diagnoses:  Sprain of right thumb, unspecified site of finger, initial encounter   splint New Prescriptions New Prescriptions   No medications on file  An After Visit Summary was printed and given to the patient. I personally performed the services in this documentation, which was scribed in my presence.  The recorded information has been reviewed and considered.   Patricia Pall.    [name] [place], PA-C 11/05/16 1953    [name], MD 11/06/16 1324

## 2016-11-05 NOTE — ED Triage Notes (Signed)
Pt stated that she stuck her r/hand on a hard surface yesterday. C/o pain in r/thumb

## 2017-02-26 ENCOUNTER — Emergency Department (HOSPITAL_COMMUNITY)
Admission: EM | Admit: 2017-02-26 | Discharge: 2017-02-26 | Disposition: A | Discharge status: Home / Self Care | Payer: Medicare Other | Attending: Emergency Medicine | Admitting: Emergency Medicine

## 2017-02-26 ENCOUNTER — Encounter (HOSPITAL_COMMUNITY): Payer: Self-pay | Admitting: Emergency Medicine

## 2017-02-26 DIAGNOSIS — R1031 Right lower quadrant pain: Secondary | ICD-10-CM | POA: Diagnosis present

## 2017-02-26 DIAGNOSIS — Z79899 Other long term (current) drug therapy: Secondary | ICD-10-CM | POA: Diagnosis not present

## 2017-02-26 DIAGNOSIS — N83201 Unspecified ovarian cyst, right side: Secondary | ICD-10-CM | POA: Diagnosis not present

## 2017-02-26 DIAGNOSIS — F17210 Nicotine dependence, cigarettes, uncomplicated: Secondary | ICD-10-CM | POA: Diagnosis not present

## 2017-02-26 LAB — COMPREHENSIVE METABOLIC PANEL
ALBUMIN: 4.2 g/dL (ref 3.5–5.0)
ALT: 12 U/L — ABNORMAL LOW (ref 14–54)
AST: 15 U/L (ref 15–41)
Alkaline Phosphatase: 44 U/L (ref 38–126)
Anion gap: 7 (ref 5–15)
BUN: 11 mg/dL (ref 6–20)
CHLORIDE: 108 mmol/L (ref 101–111)
CO2: 23 mmol/L (ref 22–32)
Calcium: 9 mg/dL (ref 8.9–10.3)
Creatinine, Ser: 1.21 mg/dL — ABNORMAL HIGH (ref 0.44–1.00)
GFR calc Af Amer: >60 mL/min (ref >60)
Glucose, Bld: 96 mg/dL (ref 65–99)
POTASSIUM: 4.3 mmol/L (ref 3.5–5.1)
Sodium: 138 mmol/L (ref 135–145)
Total Bilirubin: 0.6 mg/dL (ref 0.3–1.2)
Total Protein: 7.2 g/dL (ref 6.5–8.1)

## 2017-02-26 LAB — URINALYSIS, ROUTINE W REFLEX MICROSCOPIC
Bilirubin Urine: NEGATIVE
Glucose, UA: NEGATIVE mg/dL
HGB URINE DIPSTICK: NEGATIVE
Ketones, ur: 20 mg/dL — AB
LEUKOCYTES UA: NEGATIVE
NITRITE: NEGATIVE
PROTEIN: NEGATIVE mg/dL
Specific Gravity, Urine: 1.021 (ref 1.005–1.030)
pH: 5 (ref 5.0–8.0)

## 2017-02-26 LAB — CBC
HEMATOCRIT: 38.6 % (ref 36.0–46.0)
Hemoglobin: 12.9 g/dL (ref 12.0–15.0)
MCH: 30.5 pg (ref 26.0–34.0)
MCHC: 33.4 g/dL (ref 30.0–36.0)
MCV: 91.3 fL (ref 78.0–100.0)
Platelets: 241 10*3/uL (ref 150–400)
RBC: 4.23 MIL/uL (ref 3.87–5.11)
RDW: 13.5 % (ref 11.5–15.5)
WBC: 5.1 10*3/uL (ref 4.0–10.5)

## 2017-02-26 LAB — LIPASE, BLOOD: LIPASE: 25 U/L (ref 11–51)

## 2017-02-26 LAB — I-STAT BETA HCG BLOOD, ED (MC, WL, AP ONLY)

## 2017-02-26 NOTE — ED Triage Notes (Signed)
Pt c/o RLQ abdominal pain, nausea x several days, hx of the same. No vaginal bleeding or discharge. Denies pregnancy.

## 2017-02-26 NOTE — ED Provider Notes (Signed)
[facility] Provider Note   CSN: [id number] Arrival date & time: 02/26/17  0910     History   Chief Complaint Chief Complaint  Patient presents with  . Abdominal Pain    HPI Patricia Sanchez is a 26 y.o. female.  HPI  25yo female who is hearing impaired presents with concern for right lower quadrant pain. Pain has been present for 4 days. Comes and goes, 7/10 at the worst. Worse when moving around, better when laying down.  No fevers or chills. Nausea no vomiting. Appetite a little lower.  Similar symptoms in the fall, found to have complex right sided cyst 8/28.  No vaginal discharge or bleeding. Denies concern for STI.  Sign language interpreter used for hx  Past Medical History:  Diagnosis Date  . Deaf   . Injury of flexor tendon of hand 12/2013   right ring and small fingers  . Postoperative nausea and vomiting     Patient Active Problem List   Diagnosis Date Noted  . Deaf 11/01/2011    Past Surgical History:  Procedure Laterality Date  . BUNIONECTOMY Left   . CHOLECYSTECTOMY  11/01/2011   Procedure: LAPAROSCOPIC CHOLECYSTECTOMY;  Surgeon: Atilano Ina, MD;  Location: [facility];  Service: General;  Laterality: N/A;  with intraoperative cholangiogram  . HAND SURGERY    . TENDON REPAIR Right 12/29/2013   Procedure: FLEXOR TENDON REPAIR  RIGHT RING FINGER AND RIGHT SMALL FINGER;  Surgeon: Knute Neu, MD;  [facility];  Service: Plastics;  Laterality: Right;  . TONSILLECTOMY AND ADENOIDECTOMY  2001  . WISDOM TOOTH EXTRACTION  03/26/2011    OB History    No data available       Home Medications    Prior to Admission medications   Medication Sig Start Date End Date Taking? Authorizing Provider  sertraline (ZOLOFT) 100 MG tablet Take 100 mg by mouth daily.   Yes [provider]    Family History Family History  Problem Relation Age of Onset  . Hypertension Mother     Social History Social History  Substance  Use Topics  . Smoking status: Current Some Day Smoker    Years: 5.00    Types: Cigarettes  . Smokeless tobacco: Never Used     Comment: 2-3 cig./day  . Alcohol use 0.0 oz/week     Comment: occ     Allergies   Patient has no known allergies.   Review of Systems Review of Systems  Constitutional: Negative for fever.  HENT: Negative for sore throat.   Eyes: Negative for visual disturbance.  Respiratory: Negative for cough and shortness of breath.   Cardiovascular: Negative for chest pain.  Gastrointestinal: Positive for abdominal pain and nausea. Negative for constipation, diarrhea and vomiting.  Genitourinary: Negative for difficulty urinating.  Musculoskeletal: Negative for back pain and neck pain.  Skin: Negative for rash.  Neurological: Negative for syncope and headaches.     Physical Exam Updated Vital Signs BP 133/70 (BP Location: Left Arm)   Pulse (!) 58   Temp 98.2 F (36.8 C) (Oral)   Resp 16   SpO2 100%   Physical Exam  Constitutional: She is oriented to person, place, and time. She appears well-developed and well-nourished. No distress.  HENT:  Head: Normocephalic and atraumatic.  Eyes: Conjunctivae and EOM are normal.  Neck: Normal range of motion.  Cardiovascular: Normal rate, regular rhythm, normal heart sounds and intact distal pulses.  Exam reveals no gallop and no  friction rub.   No murmur heard. Pulmonary/Chest: Effort normal and breath sounds normal. No respiratory distress. She has no wheezes. She has no rales.  Abdominal: Soft. She exhibits no distension. There is tenderness (mild tenderness RLQ, no guarding). There is no guarding.  Musculoskeletal: She exhibits no edema or tenderness.  Neurological: She is alert and oriented to person, place, and time.  Skin: Skin is warm and dry. No rash noted. She is not diaphoretic. No erythema.  Nursing note and vitals reviewed.    ED Treatments / Results  Labs (all labs ordered are listed, but only  abnormal results are displayed) Labs Reviewed  COMPREHENSIVE METABOLIC PANEL - Abnormal; Notable for the following:       Result Value   Creatinine, Ser 1.21 (*)    ALT 12 (*)    All other components within normal limits  URINALYSIS, ROUTINE W REFLEX MICROSCOPIC - Abnormal; Notable for the following:    Ketones, ur 20 (*)    All other components within normal limits  LIPASE, BLOOD  CBC  I-STAT BETA HCG BLOOD, ED ([facility], [facility], [facility] ONLY)    EKG  EKG Interpretation None       Radiology No results found.  Procedures Procedures (including critical care time)  Medications Ordered in ED Medications - No data to display   Initial Impression / Assessment and Plan / ED Course  I have reviewed the triage vital signs and the nursing notes.  Pertinent labs & imaging results that were available during my care of the patient were reviewed by me and considered in my medical decision making (see chart for details).    25yo female with history of hearing impairment, prior RLQ pain found to have complex right cyst presents with concern for RLQ pain.  Labs show no leukocytosis, no UTI, no sign of UTI.  Discussed possible CT to evaluate for appendicitis but pt would like to continue to observe symptoms and feel this is appropriate given no vomiting, no significant pain or tenderness, no fever, no leukocytosis. Discussed that if symptoms worsen she should come back for evaluation.  Pt without colicky or severe pain and doubt ovarian torsion. Denies concern for discharge or STI and doubt TOA or PID.    Given similar symptoms in past, finding of complex right cyst, suspect symptoms are likely secondary to ovarian cyst, and recommend OBGYN follow up for outpt US and evaluation.    Final Clinical Impressions(s) / ED Diagnoses   Final diagnoses:  Right lower quadrant pain  Cyst of right ovary, history of and suspect likely contributor to pain today    New Prescriptions Discharge Medication List as  of 02/26/2017 12:30 PM       Alvira MondaySchlossman, Dwyane Dupree, MD 02/26/17 2348

## 2018-02-12 ENCOUNTER — Emergency Department (HOSPITAL_COMMUNITY)
Admission: EM | Admit: 2018-02-12 | Discharge: 2018-02-12 | Disposition: A | Discharge status: Home / Self Care | Payer: Medicare Other | Attending: Emergency Medicine | Admitting: Emergency Medicine

## 2018-02-12 ENCOUNTER — Encounter (HOSPITAL_COMMUNITY): Payer: Self-pay | Admitting: Emergency Medicine

## 2018-02-12 DIAGNOSIS — R1110 Vomiting, unspecified: Secondary | ICD-10-CM | POA: Insufficient documentation

## 2018-02-12 DIAGNOSIS — Z5321 Procedure and treatment not carried out due to patient leaving prior to being seen by health care provider: Secondary | ICD-10-CM | POA: Diagnosis not present

## 2018-02-12 MED ORDER — ONDANSETRON 4 MG PO TBDP
4.0000 mg | ORAL_TABLET | Freq: Once | ORAL | Status: AC | PRN
  Administered 2018-02-12: 4 mg via ORAL
  Filled 2018-02-12: qty 1

## 2018-02-12 NOTE — ED Notes (Signed)
Called for in waiting area no answer

## 2018-02-12 NOTE — ED Notes (Signed)
Pt called  In waiting area

## 2018-02-12 NOTE — ED Triage Notes (Signed)
Pt c/o abd pain with n/v/d x 2 days.  

## 2020-02-18 ENCOUNTER — Other Ambulatory Visit: Payer: Self-pay

## 2020-02-18 ENCOUNTER — Encounter (HOSPITAL_COMMUNITY): Payer: Self-pay

## 2020-02-18 ENCOUNTER — Emergency Department (HOSPITAL_COMMUNITY)
Admission: EM | Admit: 2020-02-18 | Discharge: 2020-02-18 | Disposition: A | Discharge status: Home / Self Care | Payer: Medicare Other | Attending: Emergency Medicine | Admitting: Emergency Medicine

## 2020-02-18 DIAGNOSIS — R195 Other fecal abnormalities: Secondary | ICD-10-CM | POA: Insufficient documentation

## 2020-02-18 DIAGNOSIS — I889 Nonspecific lymphadenitis, unspecified: Secondary | ICD-10-CM

## 2020-02-18 DIAGNOSIS — L049 Acute lymphadenitis, unspecified: Secondary | ICD-10-CM | POA: Diagnosis not present

## 2020-02-18 DIAGNOSIS — F17210 Nicotine dependence, cigarettes, uncomplicated: Secondary | ICD-10-CM | POA: Insufficient documentation

## 2020-02-18 DIAGNOSIS — R1032 Left lower quadrant pain: Secondary | ICD-10-CM | POA: Diagnosis present

## 2020-02-18 DIAGNOSIS — L739 Follicular disorder, unspecified: Secondary | ICD-10-CM | POA: Diagnosis not present

## 2020-02-18 LAB — COMPREHENSIVE METABOLIC PANEL
ALT: 15 U/L (ref 0–44)
AST: 17 U/L (ref 15–41)
Albumin: 4.3 g/dL (ref 3.5–5.0)
Alkaline Phosphatase: 48 U/L (ref 38–126)
Anion gap: 8 (ref 5–15)
BUN: 15 mg/dL (ref 6–20)
CO2: 22 mmol/L (ref 22–32)
Calcium: 8.7 mg/dL — ABNORMAL LOW (ref 8.9–10.3)
Chloride: 108 mmol/L (ref 98–111)
Creatinine, Ser: 1.15 mg/dL — ABNORMAL HIGH (ref 0.44–1.00)
GFR calc Af Amer: >60 mL/min (ref >60)
GFR calc non Af Amer: >60 mL/min (ref >60)
Glucose, Bld: 103 mg/dL — ABNORMAL HIGH (ref 70–99)
Potassium: 4.4 mmol/L (ref 3.5–5.1)
Sodium: 138 mmol/L (ref 135–145)
Total Bilirubin: 0.8 mg/dL (ref 0.3–1.2)
Total Protein: 7.4 g/dL (ref 6.5–8.1)

## 2020-02-18 LAB — I-STAT BETA HCG BLOOD, ED (MC, WL, AP ONLY): I-stat hCG, quantitative: <5 m[IU]/mL (ref <5)

## 2020-02-18 LAB — CBC
HCT: 41.1 % (ref 36.0–46.0)
Hemoglobin: 13.5 g/dL (ref 12.0–15.0)
MCH: 31 pg (ref 26.0–34.0)
MCHC: 32.8 g/dL (ref 30.0–36.0)
MCV: 94.3 fL (ref 80.0–100.0)
Platelets: 208 10*3/uL (ref 150–400)
RBC: 4.36 MIL/uL (ref 3.87–5.11)
RDW: 13.1 % (ref 11.5–15.5)
WBC: 4.7 10*3/uL (ref 4.0–10.5)
nRBC: 0 % (ref 0.0–0.2)

## 2020-02-18 LAB — TYPE AND SCREEN
ABO/RH(D): B POS
Antibody Screen: NEGATIVE

## 2020-02-18 MED ORDER — DOXYCYCLINE HYCLATE 100 MG PO TABS
100.0000 mg | ORAL_TABLET | Freq: Once | ORAL | Status: AC
  Administered 2020-02-18: 100 mg via ORAL
  Filled 2020-02-18: qty 1

## 2020-02-18 MED ORDER — HYDROCODONE-ACETAMINOPHEN 5-325 MG PO TABS
1.0000 | ORAL_TABLET | ORAL | Status: AC
  Administered 2020-02-18: 1 via ORAL
  Filled 2020-02-18: qty 1

## 2020-02-18 MED ORDER — NAPROXEN 500 MG PO TABS
500.0000 mg | ORAL_TABLET | Freq: Once | ORAL | Status: AC
  Administered 2020-02-18: 500 mg via ORAL
  Filled 2020-02-18: qty 1

## 2020-02-18 MED ORDER — DOXYCYCLINE HYCLATE 100 MG PO TABS: 100.0000 mg | ORAL_TABLET | Freq: Two times a day (BID) | ORAL | 0 refills | Status: DC

## 2020-02-18 MED ORDER — NAPROXEN 500 MG PO TABS: 500.0000 mg | ORAL_TABLET | Freq: Two times a day (BID) | ORAL | 0 refills | Status: DC

## 2020-02-18 NOTE — ED Triage Notes (Signed)
Patient states she had blood in her stool 2 weeks ago and did not remember if it was black or bright red. Patient states she had blood 2 days straight when it did occur.  Patient also c/o left groin area abscess that started 3 days ago

## 2020-02-18 NOTE — ED Notes (Signed)
LAV tube need to be recollect

## 2020-02-18 NOTE — Discharge Instructions (Signed)
Take the medications as prescribed.   Warm compresses to the area to help promote healing.  Follow up with a doctor to be rechecked in 1 week

## 2020-02-18 NOTE — ED Provider Notes (Signed)
[facility]-EMERGENCY DEPT Provider Note   CSN: [id number] Arrival date & time: 02/18/20  0740   Sign language translator used during visit  History Chief Complaint  Patient presents with  . Abscess    Patricia Sanchez is a 29 y.o. female.  HPI   Patient presented to the emergency room for pain in the left inguinal region.  Patient states that pain started 3 days ago.  It is very severe.  It feels swollen and tender.  Patient has not noticed any drainage.  She has not noticed any redness.  Patient denies any vaginal discharge.  She is currently on her menstrual period  Patient also noticed some blood in her stool a couple of weeks ago.  She did have some loose stools associated with that.  That resolved after a day or so.  She has not had any blood in her stool since.  No complaints of abdominal pain.  Past Medical History:  Diagnosis Date  . Deaf   . Injury of flexor tendon of hand 12/2013   right ring and small fingers  . Postoperative nausea and vomiting     Patient Active Problem List   Diagnosis Date Noted  . Deaf 11/01/2011    Past Surgical History:  Procedure Laterality Date  . BUNIONECTOMY Left   . CHOLECYSTECTOMY  11/01/2011   Procedure: LAPAROSCOPIC CHOLECYSTECTOMY;  Surgeon: Atilano Ina, MD;  Location: [facility];  Service: General;  Laterality: N/A;  with intraoperative cholangiogram  . HAND SURGERY    . KNEE SURGERY Right   . TENDON REPAIR Right 12/29/2013   Procedure: FLEXOR TENDON REPAIR  RIGHT RING FINGER AND RIGHT SMALL FINGER;  Surgeon: Knute Neu, MD;  Location: [facility];  Service: Plastics;  Laterality: Right;  . TONSILLECTOMY AND ADENOIDECTOMY  2001  . WISDOM TOOTH EXTRACTION  03/26/2011     OB History   No obstetric history on file.     Family History  Problem Relation Age of Onset  . Hypertension Mother     Social History   Tobacco Use  . Smoking status: Current Some Day Smoker    Years: 5.00      Types: Cigarettes  . Smokeless tobacco: Never Used  . Tobacco comment: 2-3 cig./day  Substance Use Topics  . Alcohol use: Yes    Alcohol/week: 0.0 standard drinks    Comment: occ  . Drug use: No    Home Medications Prior to Admission medications   Medication Sig Start Date End Date Taking? Authorizing Provider  doxycycline (VIBRA-TABS) 100 MG tablet Take 1 tablet (100 mg total) by mouth 2 (two) times daily. 02/18/20   Linwood Dibbles, MD  naproxen (NAPROSYN) 500 MG tablet Take 1 tablet (500 mg total) by mouth 2 (two) times daily with a meal. As needed for pain 02/18/20   Linwood Dibbles, MD    Allergies    Patient has no known allergies.  Review of Systems   Review of Systems  All other systems reviewed and are negative.   Physical Exam Updated Vital Signs BP (!) 141/109   Pulse 66   Temp 98.4 F (36.9 C) (Oral)   Resp 16   Ht 1.803 m (5\' 11" )   Wt 104.3 kg   LMP 02/18/2020   SpO2 97%   BMI 32.08 kg/m   Physical Exam Vitals and nursing note reviewed.  Constitutional:      General: She is not in acute distress.    Appearance: She  is well-developed.  HENT:     Head: Normocephalic and atraumatic.     Right Ear: External ear normal.     Left Ear: External ear normal.  Eyes:     General: No scleral icterus.       Right eye: No discharge.        Left eye: No discharge.     Conjunctiva/sclera: Conjunctivae normal.  Neck:     Trachea: No tracheal deviation.  Cardiovascular:     Rate and Rhythm: Normal rate and regular rhythm.  Pulmonary:     Effort: Pulmonary effort is normal. No respiratory distress.     Breath sounds: Normal breath sounds. No stridor. No wheezing or rales.  Abdominal:     General: Bowel sounds are normal. There is no distension.     Palpations: Abdomen is soft.     Tenderness: There is no abdominal tenderness. There is no guarding or rebound.  Genitourinary:    Comments: No lesions external genitalia, no ulceration, ttp left pubic region adjacent to  inguinal crease, no fluctuance, no induration, mild swelling Musculoskeletal:        General: No tenderness.     Cervical back: Neck supple.  Skin:    General: Skin is warm and dry.     Findings: No rash.  Neurological:     Mental Status: She is alert.     Cranial Nerves: No cranial nerve deficit (no facial droop, extraocular movements intact, no slurred speech).     Sensory: No sensory deficit.     Motor: No abnormal muscle tone or seizure activity.     Coordination: Coordination normal.     ED Results / Procedures / Treatments   Labs (all labs ordered are listed, but only abnormal results are displayed) Labs Reviewed  COMPREHENSIVE METABOLIC PANEL - Abnormal; Notable for the following components:      Result Value   Glucose, Bld 103 (*)    Creatinine, Ser 1.15 (*)    Calcium 8.7 (*)    All other components within normal limits  CBC  I-STAT BETA HCG BLOOD, ED ([facility], [facility], [facility] ONLY)  POC OCCULT BLOOD, ED  TYPE AND SCREEN    EKG None  Radiology No results found.  Procedures Procedures (including critical care time)  Medications Ordered in ED Medications  HYDROcodone-acetaminophen (NORCO/VICODIN) 5-325 MG per tablet 1 tablet (has no administration in time range)  naproxen (NAPROSYN) tablet 500 mg (500 mg Oral Given 02/18/20 1028)  doxycycline (VIBRA-TABS) tablet 100 mg (100 mg Oral Given 02/18/20 1028)    ED Course  I have reviewed the triage vital signs and the nursing notes.  Pertinent labs & imaging results that were available during my care of the patient were reviewed by me and considered in my medical decision making (see chart for details).    MDM Rules/Calculators/A&P                      Suspect lymphadenitis associated with folliculitis (pubis shaved) based on exam.  No genital lesions.  Doubt syphillis, gonorrhea, chlamydia.    No gi bleeding for last 2 weeks.  Labs reassuring.  Will dc home with pain meds, abx.  Outpt follow up Final Clinical  Impression(s) / ED Diagnoses Final diagnoses:  Lymphadenitis  Folliculitis    Rx / DC Orders ED Discharge Orders         Ordered    naproxen (NAPROSYN) 500 MG tablet  2 times daily with meals  02/18/20 1009    doxycycline (VIBRA-TABS) 100 MG tablet  2 times daily     02/18/20 1009           Linwood Dibbles, MD 02/18/20 1122

## 2020-02-18 NOTE — ED Notes (Signed)
Orthostatic VS  Lying: HR 67; BP 159/91  Sitting: HR 71; BP 140/95  Standing: HR 69; BP 141/109

## 2021-08-15 ENCOUNTER — Ambulatory Visit
Admission: RE | Admit: 2021-08-15 | Discharge: 2021-08-15 | Disposition: A | Discharge status: Home / Self Care | Payer: Medicare Other | Source: Ambulatory Visit | Attending: Physician Assistant | Admitting: Physician Assistant

## 2021-08-15 ENCOUNTER — Other Ambulatory Visit: Payer: Self-pay

## 2021-08-15 ENCOUNTER — Other Ambulatory Visit: Payer: Self-pay | Admitting: Physician Assistant

## 2021-08-15 DIAGNOSIS — R519 Headache, unspecified: Secondary | ICD-10-CM

## 2021-08-15 DIAGNOSIS — R220 Localized swelling, mass and lump, head: Secondary | ICD-10-CM

## 2021-08-15 MED ORDER — IOPAMIDOL (ISOVUE-300) INJECTION 61%
75.0000 mL | Freq: Once | INTRAVENOUS | Status: AC | PRN
  Administered 2021-08-15: 75 mL via INTRAVENOUS

## 2021-09-19 ENCOUNTER — Other Ambulatory Visit: Payer: Self-pay | Admitting: Pain Medicine

## 2021-09-19 DIAGNOSIS — E236 Other disorders of pituitary gland: Secondary | ICD-10-CM

## 2021-10-19 ENCOUNTER — Ambulatory Visit
Admission: RE | Admit: 2021-10-19 | Discharge: 2021-10-19 | Disposition: A | Discharge status: Home / Self Care | Payer: Medicare Other | Source: Ambulatory Visit | Attending: Pain Medicine | Admitting: Pain Medicine

## 2021-10-19 ENCOUNTER — Other Ambulatory Visit: Payer: Self-pay

## 2021-10-19 DIAGNOSIS — E236 Other disorders of pituitary gland: Secondary | ICD-10-CM

## 2021-11-01 ENCOUNTER — Ambulatory Visit: Payer: Medicare Other | Admitting: Neurology

## 2021-11-05 ENCOUNTER — Encounter: Payer: Self-pay | Admitting: *Deleted

## 2021-11-06 ENCOUNTER — Ambulatory Visit: Payer: Medicare Other | Admitting: Psychiatry

## 2021-11-06 ENCOUNTER — Telehealth: Payer: Self-pay | Admitting: Psychiatry

## 2021-11-06 NOTE — Telephone Encounter (Signed)
MD out sick- NP appt 1/17 cancelled.

## 2021-11-27 ENCOUNTER — Ambulatory Visit: Payer: Medicare Other | Admitting: Psychiatry

## 2021-12-17 ENCOUNTER — Encounter: Payer: Self-pay | Admitting: Psychiatry

## 2021-12-17 ENCOUNTER — Telehealth: Payer: Self-pay | Admitting: Psychiatry

## 2021-12-17 ENCOUNTER — Ambulatory Visit (INDEPENDENT_AMBULATORY_CARE_PROVIDER_SITE_OTHER): Payer: Medicare Other | Admitting: Psychiatry

## 2021-12-17 VITALS — BP 108/70 | HR 74 | Wt 297.0 lb

## 2021-12-17 DIAGNOSIS — E236 Other disorders of pituitary gland: Secondary | ICD-10-CM | POA: Diagnosis not present

## 2021-12-17 NOTE — Patient Instructions (Signed)
Plan:  Referral to ophthalmology for eye exam to look for swelling in the back of the eyes. We will decide on if you need further testing after your eye exam

## 2021-12-17 NOTE — Progress Notes (Signed)
Referring:  Shanon Rosser, PA-C [address] [place],  [place] [id number]  PCP: Sofie Rower, PA-C  Neurology was asked to evaluate Patricia Sanchez, a 31 year old patient for a chief complaint of headaches.  Our recommendations of care will be communicated by shared medical record.    CC:  headaches  HPI:  Medical co-morbidities: HTN  The patient presents for evaluation of headaches which began 4 months ago. States they never had headaches prior to this. At that time they developed constant occipital pressure. No associated photophobia. They did have associated nausea. Headaches were made worse with bending forward, coughing, stress, and pulling hair into a ponytail. Vision seemed hazy as well. Denies blurred vision, double vision, or black spots in vision.  They were noted to have elevated blood pressure and were started on amlodipine which seemed to help the headaches. They also note that they had a lot of stress and depression around the time headaches started. Has since started Lexapro and started counseling which has helped the headaches as well. They have not had headaches over the past 2 months. Vision also seems to have improved.  [facility] 08/15/21 showed a partially empty sella as well as evidence of sinus disease. MRI 10/19/21 did not show evidence of an acute process. They were referred to ophthalmology for a fundus exam, however they never received the phone call to schedule.  Headache History: Onset: 4 months ago Triggers: bending forward, coughing, pulling hair in ponytail, stress Aura: hazy Location: occiput Quality/Description: pressure Associated Symptoms:  Photophobia: no  Phonophobia: deaf at baseline  Nausea: yes Worse with activity?: yes Duration of headaches: constant  Headache days per month: 0 Headache free days per month: 30  Current Treatment: Abortive none  Preventative none  Prior Therapies                                  none   LABS: CBC Latest Ref Rng & Units 02/18/2020 02/26/2017 06/17/2016  WBC 4.0 - 10.5 K/uL 4.7 5.1 4.5  Hemoglobin 12.0 - 15.0 g/dL 13.5 12.9 13.3  Hematocrit 36.0 - 46.0 % 41.1 38.6 39.8  Platelets 150 - 400 K/uL 208 241 237   CMP Latest Ref Rng & Units 02/18/2020 02/26/2017 06/17/2016  Glucose 70 - 99 mg/dL 103(H) 96 94  BUN 6 - 20 mg/dL 15 11 9   Creatinine 0.44 - 1.00 mg/dL 1.15(H) 1.21(H) 1.09(H)  Sodium 135 - 145 mmol/L 138 138 139  Potassium 3.5 - 5.1 mmol/L 4.4 4.3 3.5  Chloride 98 - 111 mmol/L 108 108 110  CO2 22 - 32 mmol/L 22 23 22   Calcium 8.9 - 10.3 mg/dL 8.7(L) 9.0 8.9  Total Protein 6.5 - 8.1 g/dL 7.4 7.2 7.6  Total Bilirubin 0.3 - 1.2 mg/dL 0.8 0.6 0.4  Alkaline Phos 38 - 126 U/L 48 44 44  AST 15 - 41 U/L 17 15 17   ALT 0 - 44 U/L 15 12(L) 16      IMAGING:  [facility] 08/15/21: 1. Partially empty sella with small transverse sinuses bilaterally. While nonspecific and potentially normal anatomic variation, these findings can be seen with idiopathic intracranial hypertension in the correct clinical setting. 2. Otherwise, no evidence of acute intracranial abnormality. 3. Moderate paranasal sinus mucosal thickening with frothy secretions and air-fluid levels. Correlate with signs/symptoms of sinusitis.   MRI 10/19/21: Evaluation is somewhat limited by susceptibility artifact from the patient's dental hardware. Within this  limitation, no acute intracranial process.  Imaging independently reviewed on December 17, 2021   No current outpatient medications on file prior to visit.   No current facility-administered medications on file prior to visit.     Allergies: No Known Allergies  Family History: Family History  Problem Relation Age of Onset   Hypertension Mother    Hypertension Maternal Grandmother    Diabetes Maternal Grandmother      Past Medical History: Past Medical History:  Diagnosis Date   Benign intracranial hypertension    Chronic daily  headache    Deaf    Injury of flexor tendon of hand 12/19/2013   right ring and small fingers   Insomnia    Postoperative nausea and vomiting     Past Surgical History Past Surgical History:  Procedure Laterality Date   BUNIONECTOMY Left    CHOLECYSTECTOMY  11/01/2011   Procedure: LAPAROSCOPIC CHOLECYSTECTOMY;  Surgeon: Gayland Curry, MD;  Location: [facility];  Service: General;  Laterality: N/A;  with intraoperative cholangiogram   HAND SURGERY     KNEE SURGERY Right    TENDON REPAIR Right 12/29/2013   Procedure: FLEXOR TENDON REPAIR  RIGHT RING FINGER AND RIGHT SMALL FINGER;  Surgeon: Dayna Barker, MD;  Location: [facility];  Service: Plastics;  Laterality: Right;   TONSILLECTOMY AND ADENOIDECTOMY  2001   WISDOM TOOTH EXTRACTION  03/26/2011    Social History: Social History   Tobacco Use   Smoking status: Some Days    Years: 5.00    Types: Cigarettes   Smokeless tobacco: Never   Tobacco comments:    2-3 cig./day  Vaping Use   Vaping Use: Never used  Substance Use Topics   Alcohol use: Yes    Alcohol/week: 0.0 standard drinks    Comment: occ   Drug use: No    ROS: Negative for fevers, chills. Positive for headaches. All other systems reviewed and negative unless stated otherwise in HPI.   Physical Exam:   Vital Signs: BP 108/70    Pulse 74    Wt 297 lb (134.7 kg)    SpO2 99%    BMI 41.42 kg/m  GENERAL: well appearing,in no acute distress,alert SKIN:  Color, texture, turgor normal. No rashes or lesions HEAD:  Normocephalic/atraumatic. CV:  RRR RESP: Normal respiratory effort MSK: no tenderness to palpation over occiput, neck, or shoulders  NEUROLOGICAL: Mental Status: Alert, oriented to person, place and time,Follows commands Cranial Nerves: PERRL, no papilledema visualized on fundoscopic exam, visual fields intact to confrontation, extraocular movements intact, facial sensation intact, no facial droop or ptosis, deaf (baseline) Motor: muscle  strength 5/5 both upper and lower extremities,no drift, normal tone Reflexes: 2+ throughout Sensation: intact to light touch all 4 extremities Coordination: Finger-to- nose-finger intact bilaterally Gait: normal-based   IMPRESSION: 31 year old patient with a history of HTN and depression/anxiety who presents for evaluation of headaches and empty sella. They have not experienced headaches or vision changes for the past 2 months. No papilledema visualized on direct fundoscopy. Will refer to Ophthalmology for a formal fundus exam to assess for subtle papilledema. Discussed next steps including possible LP if there is evidence of optic disc swelling on eye exam.  PLAN: -Referral to ophthalmology for formal fundus exam -Will plan for CTV and LP if ophthalmology finds evidence of papilledema on eye exam   I spent a total of 60 minutes chart reviewing and counseling the patient. Headache education was done.  Written educational materials and patient  instructions outlining all of the above were given.  Follow-up: after eye exam   Genia Harold, MD 12/17/2021   3:16 PM

## 2021-12-17 NOTE — Telephone Encounter (Signed)
Sent to Dr. [facility] ph # [phone] 

## 2022-07-12 IMAGING — MR MR HEAD W/O CM
10 series · 48 of 48 positions shown · non-contrast
Comparison: No prior MRI, correlation is made with CT head
08/15/2021

CLINICAL DATA: Head pain

EXAM:
MRI HEAD WITHOUT CONTRAST
TECHNIQUE: Multiplanar, multiecho pulse sequences of the brain and surrounding
structures were obtained without intravenous contrast.

[Series 3: T1 · sagittal · 5.0mm · 0.45mm/px · 2 of 21 slices shown]
[im 1/21]
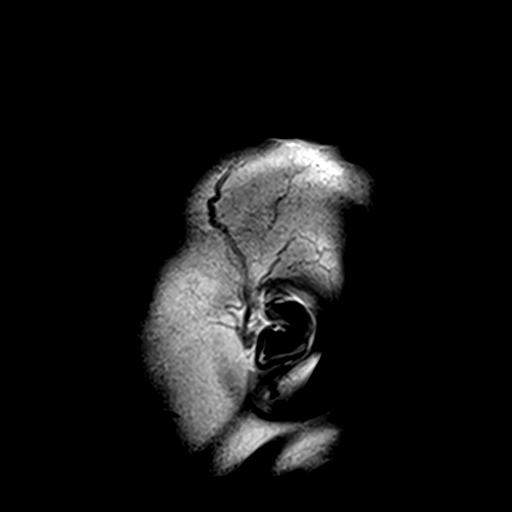
[im 21/21]
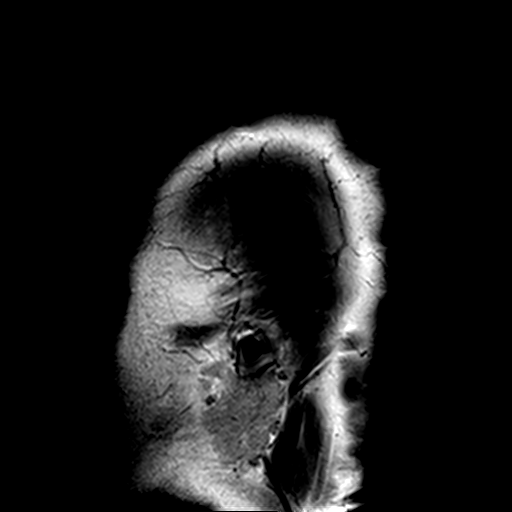

[Series 4: DWI · axial · 3.0mm · 1.80mm/px · z∈[-72,+74]mm · 8 of 80 slices shown (1 of 4)]
[im 1/80]
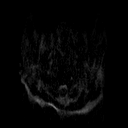
[im 12/80]
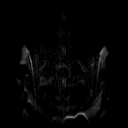
[im 23/80]
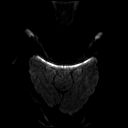
[im 34/80]
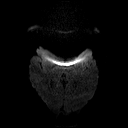
[im 46/80]
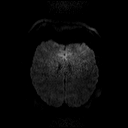
[im 57/80]
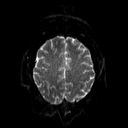
[im 68/80]
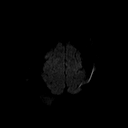
[im 80/80]
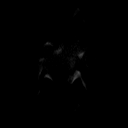

[Series 5: DWI · axial · 3.0mm · 1.80mm/px · z∈[-72,+74]mm · 5 of 50 slices shown (2 of 4)]
[im 1/50]
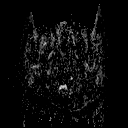
[im 13/50]
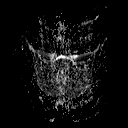
[im 25/50]
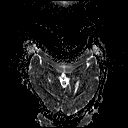
[im 37/50]
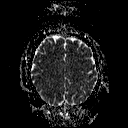
[im 50/50]
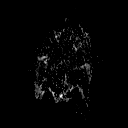

[Series 6: DWI · coronal · 5.0mm · 1.80mm/px · 6 of 66 slices shown (3 of 4)]
[im 1/66]
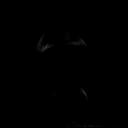
[im 14/66]
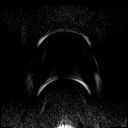
[im 27/66]
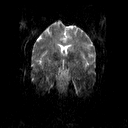
[im 40/66]
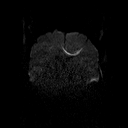
[im 53/66]
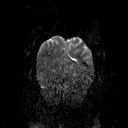
[im 66/66]
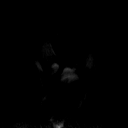

[Series 7: DWI · coronal · 5.0mm · 1.80mm/px · 3 of 34 slices shown (4 of 4)]
[im 1/34]
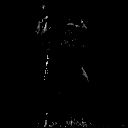
[im 17/34]
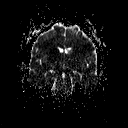
[im 34/34]
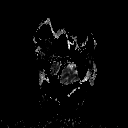

[Series 8: T2 · axial · 5.0mm · 0.51mm/px · z∈[-73,+74]mm · 2 of 22 slices shown (1 of 2)]
[im 1/22]
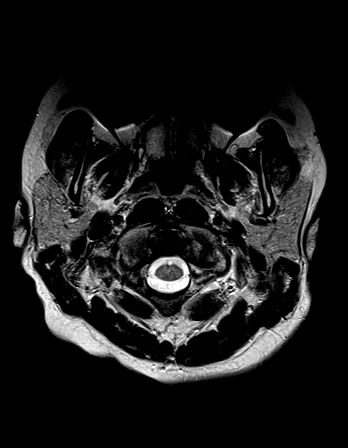
[im 22/22]
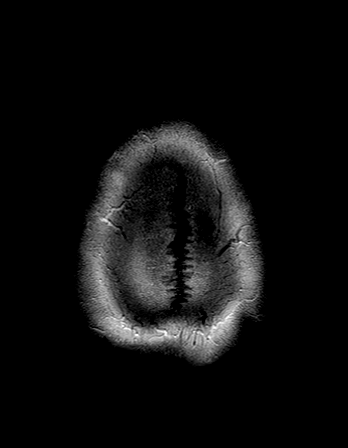

[Series 9: FLAIR · axial · 3.0mm · 0.45mm/px · z∈[-66,+68]mm · 3 of 30 slices shown]
[im 1/30]
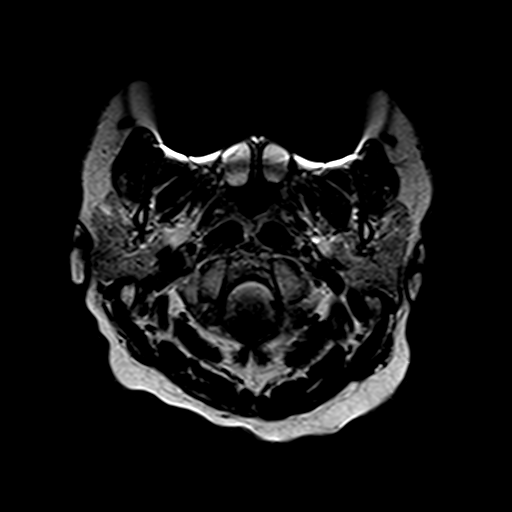
[im 15/30]
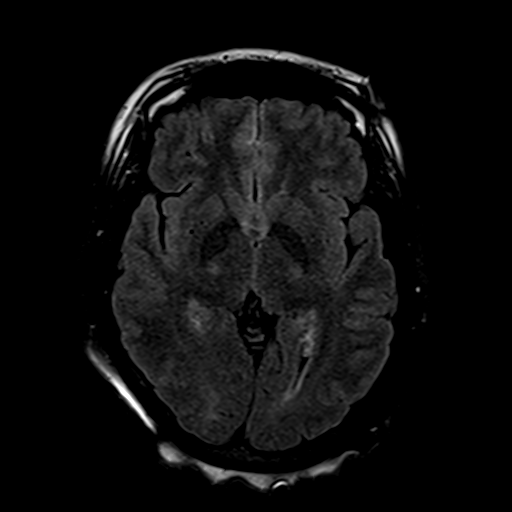
[im 30/30]
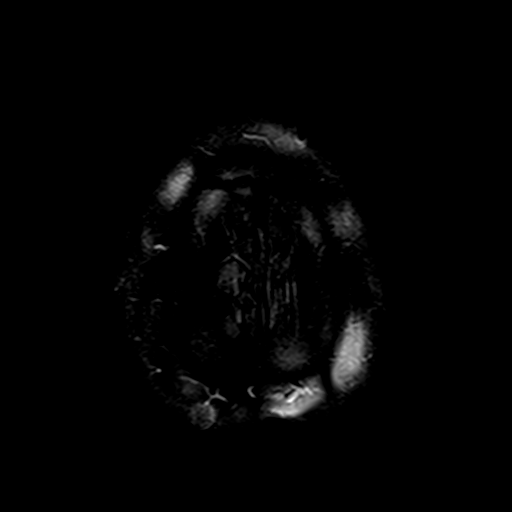

[Series 11: swi_images · axial · 4.0mm · 0.90mm/px · z∈[-69,+71]mm · 3 of 36 slices shown]
[im 1/36]
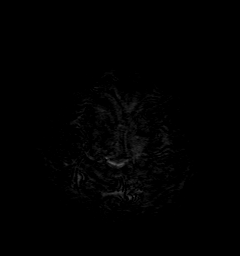
[im 18/36]
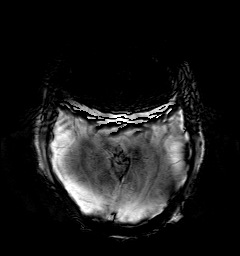
[im 36/36]
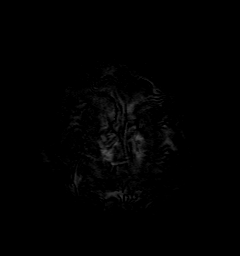

[Series 12: t1_mpr_tra · axial · 1.0mm · 0.71mm/px · z∈[-71,+72]mm · 14 of 144 slices shown]
[im 1/144]
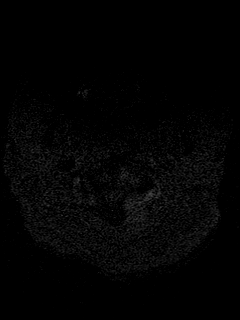
[im 12/144]
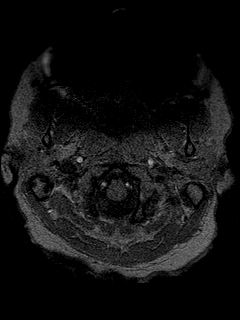
[im 23/144]
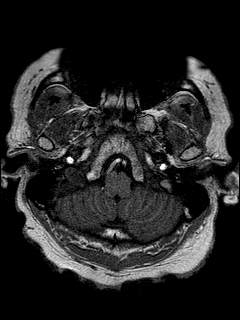
[im 34/144]
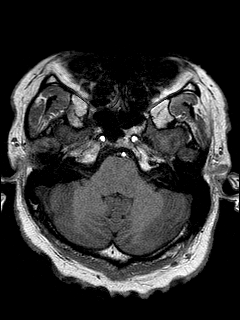
[im 45/144]
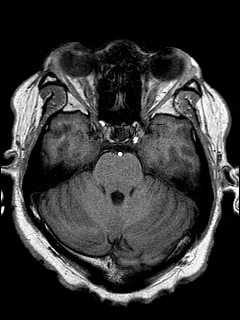
[im 56/144]
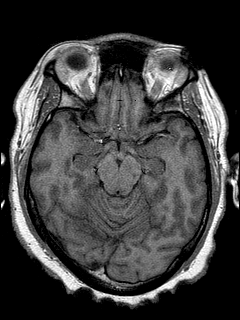
[im 67/144]
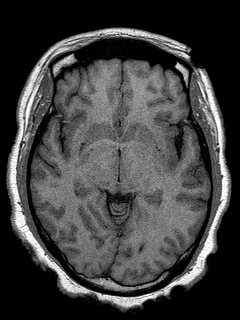
[im 78/144]
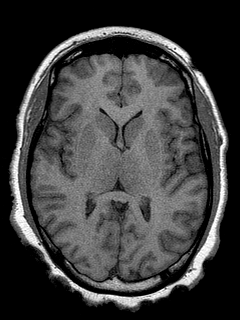
[im 89/144]
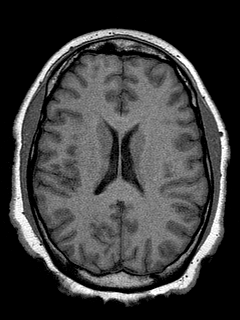
[im 100/144]
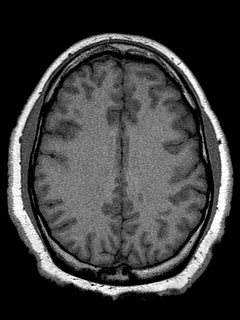
[im 111/144]
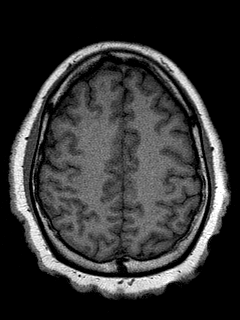
[im 122/144]
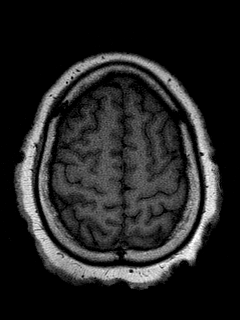
[im 133/144]
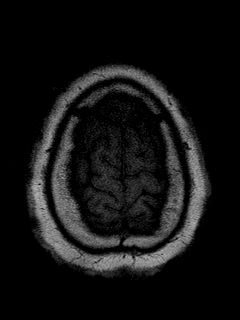
[im 144/144]
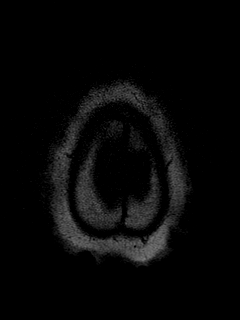

[Series 13: T2 · coronal · 5.0mm · 0.45mm/px · 2 of 25 slices shown (2 of 2)]
[im 1/25]
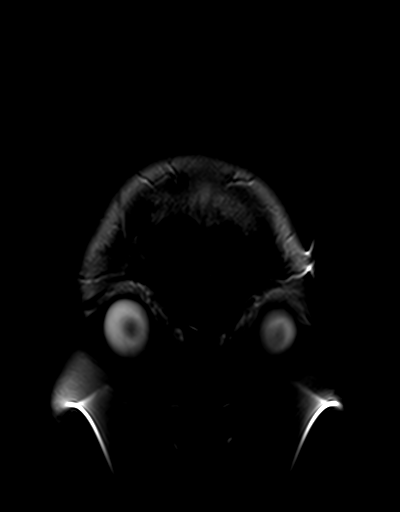
[im 25/25]
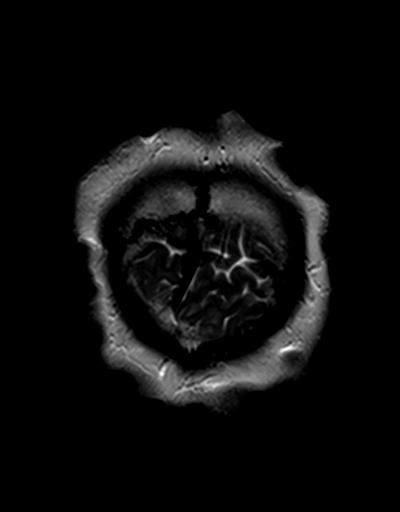

[48 of 48 positions shown; findings below may reference images not displayed]

FINDINGS: Brain: Evaluation is somewhat limited by susceptibility artifact
from the patient's dental hardware. Within this limitation, no
restricted diffusion to suggest acute or subacute infarct. No acute
hemorrhage, mass, mass effect, or midline shift. No hydrocephalus or
extra-axial collection. No abnormal T2 signal in the parenchyma.

Vascular: Normal flow voids.

Skull and upper cervical spine: Normal marrow signal.

Sinuses/Orbits: Limited by susceptibility artifact. Within this
limitation, there is mild mucosal thickening in the ethmoid air
cells. The orbits are unremarkable.

Other: The mastoids are well aerated.
IMPRESSION: Evaluation is somewhat limited by susceptibility artifact from the
patient's dental hardware. Within this limitation, no acute
intracranial process.

## 2022-08-12 ENCOUNTER — Telehealth: Payer: Self-pay | Admitting: Psychiatry

## 2022-08-12 NOTE — Telephone Encounter (Signed)
Patient called in using sign language interpreter. We received new referral from PCP for worsening migraines, patient stated pressure in head is worsening and needs to be seen. I asked if she ever followed up with [facility] on the referral we sent. She stated she did not even remember coming to see [place] that she does not believe she has seen [place] before. I once again confirmed DOB and demographics with patient and verified I am speaking with the correct patient. She does not remember coming to see [place], nor did she ever hear from [facility]. I got patient scheduled for an appointment on 11/10 with Dr. Billey Gosling for a f/u but advised she needs to see ophthalmology, and that I would call them and see what happened with referral. Patient agreed to this, but also let her know I would send a message to Dr. Billey Gosling and see if she advises of anything different in the meantime while waiting for appt. Aware Dr. Billey Gosling is out of office today we will call her back tomorrow regarding this. I then called Dr. [place] Desanctis stated they reached out to patient on 12/19/21 and 01/17/22 but were unable to LVM. I asked if they had an ASL interpreter to use because patient is deaf, they stated they were not aware of this and would reach out to patient again if referral is resent over. Annie-can you please re-send the original referral we sent to [facility] office with a note indicating patient is deaf and needs ASL interpreter to schedule.Thanks

## 2022-08-12 NOTE — Telephone Encounter (Addendum)
Referral for Ophthalmology fax to [facility]. Phone: [phone], Fax: [phone] 

## 2022-08-13 ENCOUNTER — Other Ambulatory Visit: Payer: Self-pay | Admitting: Psychiatry

## 2022-08-13 ENCOUNTER — Encounter: Payer: Self-pay | Admitting: *Deleted

## 2022-08-13 MED ORDER — TOPIRAMATE 25 MG PO TABS: ORAL_TABLET | ORAL | 6 refills | Status: DC

## 2022-08-13 NOTE — Telephone Encounter (Signed)
I sent an rx for Topamax to their pharmacy. They can start this to help with headaches while I wait for them to see ophthalmology

## 2022-08-14 MED ORDER — TOPIRAMATE 25 MG PO TABS: ORAL_TABLET | ORAL | 0 refills | Status: DC

## 2022-08-14 MED ORDER — TOPIRAMATE 100 MG PO TABS: 100.0000 mg | ORAL_TABLET | Freq: Two times a day (BID) | ORAL | 6 refills | Status: DC

## 2022-08-14 NOTE — Telephone Encounter (Signed)
Called [facility], spoke with Imani who stated insurance iwll only pay for up to three 25 mg tabs daily, not 4 daily. I advised Imani that when patient needs 100 mg a new Rx can be sent in for 100 mg tabs, not 25 mg tabs. She requested new Rx be sent to reflect this, so Rx will go through. Marland Kitchen

## 2022-08-14 NOTE — Addendum Note (Signed)
Addended by: Genia Harold on: 08/14/2022 10:20 AM   Modules accepted: Orders

## 2022-08-14 NOTE — Telephone Encounter (Signed)
Called pt thru sign language interpreter, no answer so she left message. I advised of call to pharmacy and new Rx sent. I advised she notify us after she completes 3 weeks of Topiramate and new Rx can be sent for maintenance dose. Left #.

## 2022-08-14 NOTE — Telephone Encounter (Signed)
New rx sent

## 2022-08-14 NOTE — Telephone Encounter (Signed)
Pt called stating that the pharmacy informed her that her ins is not covering this medication and that it is needing a PA. Please advise.

## 2022-08-27 ENCOUNTER — Encounter: Payer: Self-pay | Admitting: *Deleted

## 2022-08-29 NOTE — Progress Notes (Signed)
   CC:  headaches  Follow-up Visit  Last visit: 12/17/21  Brief HPI: 31 year old nonbinary patient with a history of HTN who follows in clinic for headaches. 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   At their last visit, they had reported no headaches since starting BP medication and Lexapro. They were referred to ophthalmology to assess for papilledema.   Interval History: Headaches returned in October 2023 and they were prescribed on Topamax for headache prevention. However they never started Topamax due to concerns for side effects. Their headaches have increased in frequency and are now constant. Headaches are worsened with lying flat. No vision changes. They saw ophthalmology last week and were told they had increased pressure behind their left eye.   Headache days per month: 30 Headache free days per month: 0  Current Headache Regimen: Preventative: none Abortive: none   Prior Therapies                                  none  Physical Exam:   Vital Signs: BP 134/86   Pulse 71   Wt (!) 300 lb 9.6 oz (136.4 kg)   BMI 41.93 kg/m  GENERAL:  well appearing, in no acute distress, alert  SKIN:  Color, texture, turgor normal. No rashes or lesions HEAD:  Normocephalic/atraumatic. RESP: normal respiratory effort MSK:  No gross joint deformities.   NEUROLOGICAL: Mental Status: Alert, oriented to person, place and time, Follows commands, and Speech fluent and appropriate. Cranial Nerves: PERRL, face symmetric, no dysarthria, hearing grossly intact Motor: moves all extremities equally Gait: normal-based.  IMPRESSION: 31 year old nonbinary patient with a history of HTN and depression/anxiety who presents for follow up of headaches and empty sella. Ophthalmology exam reportedly with papilledema OS. Will order CT venogram and lumbar puncture. 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  PLAN: -CTV -Lumbar puncture -Start Diamox 250 mg BID  Follow-up: 6 months  I spent a total  of 24 minutes on the date of the service. Headache education was done. Discussed treatment options including preventive medications. Written educational materials and patient instructions outlining all of the above were given.  Ocie Doyne, MD 08/30/22 10:05 AM

## 2022-08-30 ENCOUNTER — Encounter: Payer: Self-pay | Admitting: Psychiatry

## 2022-08-30 ENCOUNTER — Ambulatory Visit (INDEPENDENT_AMBULATORY_CARE_PROVIDER_SITE_OTHER): Payer: Medicare Other | Admitting: Psychiatry

## 2022-08-30 VITALS — BP 134/86 | HR 71 | Wt 300.6 lb

## 2022-08-30 DIAGNOSIS — R519 Headache, unspecified: Secondary | ICD-10-CM | POA: Diagnosis not present

## 2022-08-30 DIAGNOSIS — H4710 Unspecified papilledema: Secondary | ICD-10-CM | POA: Diagnosis not present

## 2022-08-30 MED ORDER — ACETAZOLAMIDE 250 MG PO TABS: 250.0000 mg | ORAL_TABLET | Freq: Two times a day (BID) | ORAL | 6 refills | Status: DC

## 2022-08-30 NOTE — Patient Instructions (Signed)
Plan: -CT venogram of the head -Spinal tap -Start acetazolamide (Diamox) 250 mg twice a day to help lower the pressure in your spinal fluid

## 2022-09-02 ENCOUNTER — Telehealth: Payer: Self-pay | Admitting: Psychiatry

## 2022-09-02 NOTE — Telephone Encounter (Signed)
medicare/medicaid NPR sent to GI [phone] 

## 2022-09-18 NOTE — Progress Notes (Signed)
  Video Visit Note This medical encounter was conducted virtually using [email or web address]  TeleHealth protocols.  I have identified myself to the patient and conveyed my credentials to   [facility] Draft Patient/proxy has provided verbal consent to proceed with this video visit. In case we get disconnected, [phone] (home)  In case of Emergency, patient's current location: Home: [address]  [place] [address] Is there someone else in the room? No.   Assessment/Plan:    Congenital hearing loss Video visit conducted with patient's sign language interpreter via call, and epic video intermittently Log in to MyChart app for video component [phone] - audio component  Gender-affirming care - masculinizing hormone therapy Preferred name Zada Dawn, pronouns: they/them. Patient deaf and nonbinary. Have not yet started testosterone . Other goals for transition include: top surgery - Current regimen: 20mg  testosterone  gel (was not able to pick up yet, wants to use goodRx $50 per bottle at [facility] - Labs: CBC, total testosterone  at 3, 6, and 12 months, then annually thereafter - Goal testosterone  in mid-physiologic range for cis female (300-700) - Goal hematocrit <55  - Organ inventory: updated - Amenorrheic: no - Injection supplies/teaching: n/a - Injection day: n/a - PrEP eligibility: n/a - Contraception: female partner, desires IVF. Referral made 10/4 via online referral form for reproductive endocrinology clinic; gave patient # to call  - Follow-up in 1 month - on testosterone , fertility referral, plastics referral - do labs 3 months from start of testosterone  use  Attending: Dr. Vinita, Vernell BRAVO, MD  Future Appointments  Date Time Provider Department Center  10/30/2022  3:40 PM Hampe, Ronal BRAVO, MD [facility]     Subjective:     Dawn Draft is a 31 y.o. 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 I conducted a audio/video visit. I spent  25 minutes on the video call with the patient. I spent an additional 3 minutes on pre- and post-visit activities on the date of service .   [facility]  at [facility] [address] [place], [place] [address]  . Telephone [phone] . Fax [phone] [email or web address]

## 2022-09-20 NOTE — Progress Notes (Signed)
 I was immediately available and present on site. I agree with the assessment and plan as documented in the resident's note. Benna Dunks, MD

## 2022-10-09 ENCOUNTER — Ambulatory Visit
Admission: RE | Admit: 2022-10-09 | Discharge: 2022-10-09 | Disposition: A | Discharge status: Home / Self Care | Payer: Medicare Other | Source: Ambulatory Visit | Attending: Psychiatry | Admitting: Psychiatry

## 2022-10-09 DIAGNOSIS — R519 Headache, unspecified: Secondary | ICD-10-CM

## 2022-10-09 MED ORDER — IOPAMIDOL (ISOVUE-370) INJECTION 76%
75.0000 mL | Freq: Once | INTRAVENOUS | Status: AC | PRN
  Administered 2022-10-09: 75 mL via INTRAVENOUS

## 2022-11-13 ENCOUNTER — Other Ambulatory Visit: Payer: Self-pay

## 2022-11-13 ENCOUNTER — Emergency Department (HOSPITAL_COMMUNITY)
Admission: EM | Admit: 2022-11-13 | Discharge: 2022-11-13 | Disposition: A | Discharge status: Home / Self Care | Payer: Medicare Other | Attending: Emergency Medicine | Admitting: Emergency Medicine

## 2022-11-13 DIAGNOSIS — Z1152 Encounter for screening for COVID-19: Secondary | ICD-10-CM | POA: Diagnosis not present

## 2022-11-13 DIAGNOSIS — J101 Influenza due to other identified influenza virus with other respiratory manifestations: Secondary | ICD-10-CM | POA: Diagnosis not present

## 2022-11-13 DIAGNOSIS — R059 Cough, unspecified: Secondary | ICD-10-CM | POA: Diagnosis present

## 2022-11-13 LAB — RESP PANEL BY RT-PCR (RSV, FLU A&B, COVID)  RVPGX2
Influenza A by PCR: NEGATIVE
Influenza B by PCR: POSITIVE — AB
Resp Syncytial Virus by PCR: NEGATIVE
SARS Coronavirus 2 by RT PCR: NEGATIVE

## 2022-11-13 MED ORDER — OSELTAMIVIR PHOSPHATE 75 MG PO CAPS: 75.0000 mg | ORAL_CAPSULE | Freq: Two times a day (BID) | ORAL | 0 refills | Status: DC

## 2022-11-13 NOTE — Discharge Instructions (Addendum)
You were diagnosed today with the flu.  Please take the prescribed Tamiflu as directed.  Please follow-up as needed with your primary care provider.  You may take ibuprofen or Tylenol as needed for fever and pain control.  Please be sure to stay hydrated.  If you develop any life-threatening symptoms return to the emergency department

## 2022-11-13 NOTE — ED Triage Notes (Addendum)
Pt arrives with reports of generalized body aches, chest pain, headache and cough over the last two days. Pt has been in contact with a respiratory illness.  ASL interpreter used [phone]

## 2022-11-13 NOTE — ED Provider Notes (Signed)
[facility] EMERGENCY DEPARTMENT AT [facility] Provider Note   CSN: [id number] Arrival date & time: 11/13/22  0549     History  Chief Complaint  Patient presents with   Generalized Body Aches   Headache   Chest Pain    Patricia Sanchez is a 32 y.o. adult.  Patient presents the emergency department complaining of generalized body aches, cough, and headache which began yesterday.  She denies known fevers at home, abdominal pain, vomiting.  She does endorse mild nausea.  Patient states she has been in contact with people with respiratory illnesses over the past week.  Past medical history significant for chronic daily headaches, benign intracranial hypertension, insomnia, deafness  ASL interpreter used throughout encounter  HPI     Home Medications Prior to Admission medications   Medication Sig Start Date End Date Taking? Authorizing Provider  oseltamivir (TAMIFLU) 75 MG capsule Take 1 capsule (75 mg total) by mouth every 12 (twelve) hours. 11/13/22  Yes Dorothyann Peng, PA-C  acetaZOLAMIDE (DIAMOX) 250 MG tablet Take 1 tablet (250 mg total) by mouth 2 (two) times daily. 08/30/22   Genia Harold, MD      Allergies    Patient has no known allergies.    Review of Systems   Review of Systems  Constitutional:  Negative for fever.  Respiratory:  Positive for cough. Negative for shortness of breath.   Musculoskeletal:  Positive for myalgias.  Neurological:  Positive for headaches.    Physical Exam Updated Vital Signs BP (!) 145/104   Pulse 93   Temp 99.3 F (37.4 C) (Oral)   Resp 18   Ht 5\' 11"  (1.803 m)   Wt 136.1 kg   SpO2 99%   BMI 41.84 kg/m  Physical Exam Vitals and nursing note reviewed.  Constitutional:      General: Patricia J Allen-Draft "Ned Clines" is not in acute distress.    Appearance: Patricia J Allen-Draft "Ned Clines" is well-developed.  HENT:     Head: Normocephalic and atraumatic.  Eyes:     Conjunctiva/sclera: Conjunctivae  normal.  Cardiovascular:     Rate and Rhythm: Normal rate and regular rhythm.     Heart sounds: No murmur heard. Pulmonary:     Effort: Pulmonary effort is normal. No respiratory distress.     Breath sounds: Normal breath sounds.  Abdominal:     Palpations: Abdomen is soft.     Tenderness: There is no abdominal tenderness.  Musculoskeletal:        General: No swelling.     Cervical back: Neck supple.  Skin:    General: Skin is warm and dry.     Capillary Refill: Capillary refill takes less than 2 seconds.  Neurological:     Mental Status: Patricia J Allen-Draft "Ned Clines" is alert.  Psychiatric:        Mood and Affect: Mood normal.     ED Results / Procedures / Treatments   Labs (all labs ordered are listed, but only abnormal results are displayed) Labs Reviewed  RESP PANEL BY RT-PCR (RSV, FLU A&B, COVID)  RVPGX2 - Abnormal; Notable for the following components:      Result Value   Influenza B by PCR POSITIVE (*)    All other components within normal limits    EKG None  Radiology No results found.  Procedures Procedures    Medications Ordered in ED Medications - No data to display  ED Course/ Medical Decision Making/ A&P  Medical Decision Making  Patient presents with chief complaint of cough, headache, body aches.  Differential diagnosis includes but is not limited to COVID-19, influenza, RSV, other viral illnesses, and others  I reviewed the patient's past medical history.  The patient had an office visit in October due to a COVID-19 infection  Reviewed labs.  Pertinent results include positive influenza B test  I ordered and reviewed an EKG which showed an underlying rhythm of sinus rhythm  The patient had a low-grade temperature in the 99 range.  Patient appears stable overall.  Plan to discharge home with diagnosis of influenza with prescription for Tamiflu and recommendations for supportive care including Tylenol or  ibuprofen.  Patient may follow-up as needed with her primary care provider.        Final Clinical Impression(s) / ED Diagnoses Final diagnoses:  Influenza B    Rx / DC Orders ED Discharge Orders          Ordered    oseltamivir (TAMIFLU) 75 MG capsule  Every 12 hours        11/13/22 0956              Dorothyann Peng, PA-C 11/13/22 1000    Ezequiel Essex, MD 11/13/22 1517

## 2023-02-05 NOTE — Progress Notes (Unsigned)
   CC:  headaches  Follow-up Visit  Last visit: 08/30/22  Brief HPI: 32 year old nonbinary patient with a history of HTN who follows in clinic for headaches. 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 Ophthalmology exam showed papilledema OS.  At their last visit, CTV and LP were ordered. 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 LP was ordered but not done.***  Headaches***Diamox***papilledema***   Headache days per month: *** Migraine days per month*** Headache free days per month: ***  Current Headache Regimen: Preventative: *** Abortive: ***   Prior Therapies                                  ***  Physical Exam:   Vital Signs: There were no vitals taken for this visit. GENERAL:  well appearing, in no acute distress, alert  SKIN:  Color, texture, turgor normal. No rashes or lesions HEAD:  Normocephalic/atraumatic. RESP: normal respiratory effort MSK:  No gross joint deformities.   NEUROLOGICAL: Mental Status: Alert, oriented to person, place and time, Follows commands, and Speech fluent and appropriate. Cranial Nerves: PERRL, face symmetric, no dysarthria, hearing grossly intact Motor: moves all extremities equally Gait: normal-based.  IMPRESSION: ***  PLAN: ***   Follow-up: ***  I spent a total of *** minutes on the date of the service. Headache education was done. Discussed lifestyle modification including increased oral hydration, decreased caffeine, exercise and stress management. Discussed treatment options including preventive and acute medications, natural supplements, and infusion therapy. Discussed medication overuse headache and to limit use of acute treatments to no more than 2 days/week or 10 days/month. Discussed medication side effects, adverse reactions and drug interactions. Written educational materials and patient  instructions outlining all of the above were given.  Ocie Doyne, MD

## 2023-02-06 ENCOUNTER — Ambulatory Visit (INDEPENDENT_AMBULATORY_CARE_PROVIDER_SITE_OTHER): Payer: Medicare Other | Admitting: Psychiatry

## 2023-02-06 ENCOUNTER — Encounter: Payer: Self-pay | Admitting: Psychiatry

## 2023-02-06 VITALS — BP 147/85 | HR 67 | Ht 67.0 in | Wt 310.0 lb

## 2023-02-06 DIAGNOSIS — G932 Benign intracranial hypertension: Secondary | ICD-10-CM

## 2023-02-06 MED ORDER — ACETAZOLAMIDE 250 MG PO TABS: 250.0000 mg | ORAL_TABLET | Freq: Two times a day (BID) | ORAL | 8 refills | Status: AC

## 2023-02-06 NOTE — Addendum Note (Signed)
Addended by: Ocie Doyne on: 02/06/2023 09:04 AM   Modules accepted: Orders

## 2023-05-23 ENCOUNTER — Encounter: Payer: Self-pay | Admitting: Gastroenterology

## 2023-06-12 ENCOUNTER — Telehealth: Payer: Self-pay | Admitting: Psychiatry

## 2023-06-12 ENCOUNTER — Encounter: Payer: Self-pay | Admitting: Psychiatry

## 2023-06-12 NOTE — Telephone Encounter (Signed)
Sent letter and Earleen Reaper msg informing pt of need to reschedule 10/13/23 appt - MD leaving practice

## 2023-08-13 ENCOUNTER — Other Ambulatory Visit (INDEPENDENT_AMBULATORY_CARE_PROVIDER_SITE_OTHER): Payer: Medicare Other

## 2023-08-13 ENCOUNTER — Encounter: Payer: Self-pay | Admitting: Gastroenterology

## 2023-08-13 ENCOUNTER — Ambulatory Visit (INDEPENDENT_AMBULATORY_CARE_PROVIDER_SITE_OTHER): Payer: Medicare Other | Admitting: Gastroenterology

## 2023-08-13 VITALS — BP 130/80 | HR 71 | Ht 67.0 in | Wt 300.0 lb

## 2023-08-13 DIAGNOSIS — K625 Hemorrhage of anus and rectum: Secondary | ICD-10-CM | POA: Diagnosis not present

## 2023-08-13 DIAGNOSIS — R112 Nausea with vomiting, unspecified: Secondary | ICD-10-CM

## 2023-08-13 DIAGNOSIS — R195 Other fecal abnormalities: Secondary | ICD-10-CM | POA: Diagnosis not present

## 2023-08-13 DIAGNOSIS — R197 Diarrhea, unspecified: Secondary | ICD-10-CM

## 2023-08-13 LAB — CBC WITH DIFFERENTIAL/PLATELET
Basophils Absolute: 0 10*3/uL (ref 0.0–0.1)
Basophils Relative: 0.5 % (ref 0.0–3.0)
Eosinophils Absolute: 0.3 10*3/uL (ref 0.0–0.7)
Eosinophils Relative: 5.3 % — ABNORMAL HIGH (ref 0.0–5.0)
HCT: 40.7 % (ref 36.0–46.0)
Hemoglobin: 13.1 g/dL (ref 12.0–15.0)
Lymphocytes Relative: 40.3 % (ref 12.0–46.0)
Lymphs Abs: 2.2 10*3/uL (ref 0.7–4.0)
MCHC: 32.2 g/dL (ref 30.0–36.0)
MCV: 91.3 fL (ref 78.0–100.0)
Monocytes Absolute: 0.3 10*3/uL (ref 0.1–1.0)
Monocytes Relative: 5.5 % (ref 3.0–12.0)
Neutro Abs: 2.7 10*3/uL (ref 1.4–7.7)
Neutrophils Relative %: 48.4 % (ref 43.0–77.0)
Platelets: 259 10*3/uL (ref 150.0–400.0)
RBC: 4.46 Mil/uL (ref 3.87–5.11)
RDW: 14 % (ref 11.5–15.5)
WBC: 5.5 10*3/uL (ref 4.0–10.5)

## 2023-08-13 LAB — COMPREHENSIVE METABOLIC PANEL
ALT: 18 U/L (ref 0–35)
AST: 16 U/L (ref 0–37)
Albumin: 4.6 g/dL (ref 3.5–5.2)
Alkaline Phosphatase: 51 U/L (ref 39–117)
BUN: 13 mg/dL (ref 6–23)
CO2: 25 meq/L (ref 19–32)
Calcium: 9.3 mg/dL (ref 8.4–10.5)
Chloride: 105 meq/L (ref 96–112)
Creatinine, Ser: 1.1 mg/dL (ref 0.40–1.20)
GFR: 65.88 mL/min (ref >60.00)
Glucose, Bld: 78 mg/dL (ref 70–99)
Potassium: 3.9 meq/L (ref 3.5–5.1)
Sodium: 135 meq/L (ref 135–145)
Total Bilirubin: 0.6 mg/dL (ref 0.2–1.2)
Total Protein: 7.6 g/dL (ref 6.0–8.3)

## 2023-08-13 LAB — TSH: TSH: 2.73 u[IU]/mL (ref 0.35–5.50)

## 2023-08-13 MED ORDER — NA SULFATE-K SULFATE-MG SULF 17.5-3.13-1.6 GM/177ML PO SOLN: 1.0000 | Freq: Once | ORAL | 0 refills | Status: AC

## 2023-08-13 NOTE — Progress Notes (Signed)
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  ASL interpreter was used for the duration of this encounter  Patient previously seen by Dr. Elnoria Howard prior to 2015 in which they were referred to [facility] (Dr. Maudry Mayhew) for evaluation of nausea, vomiting, weight loss January 2015.  At that time patient was having nausea every morning with associated shakes.  She had cholecystectomy without relief in symptoms.  At that time marijuana helped the nausea.  Patient had upper endoscopy in 2012 by Dr. Elnoria Howard which was reportedly unremarkable.  Dr. Maudry Mayhew recommended Dramamine patch.  Patient most recently saw Dr. Elnoria Howard 05/26/2023.  Unable to see these records.  Patient presents today reporting having a GI episode once per year that includes nausea, vomiting, loose bloody stools.  They state it typically last for about 2 weeks and then dissipates on its own.  The patient does not have associated abdominal pain.  The patient is on Naprosyn 500 Mg twice daily for headaches.  They note during these flares they are unsure if associated stress or certain foods.  Patient does note that beef will often trigger this episode and sometimes broccoli.  Outside of these episodes that the patient has no symptoms and has regular bowel movements.  Patient was noted to have moderate-sized hemorrhoids on colonoscopy in 2012.  Patient states they smoked marijuana on a daily basis and it is the only thing that helps them to get through a meal without having nausea or vomiting.   PREVIOUS GI WORKUP   CT enteroabdomen pelvis with contrast 01/08/2012 ordered by Dr. Elnoria Howard for abdominal pain, diarrhea, vomiting - Short segment inner wall and hyperenhancement in the jejunum.  No associated  perienteric edema or inflammation.  No bowel obstruction.  Terminal ileum normal.  EGD 09/2011 for abdominal pain - Normal EGD  Colonoscopy 09/2011 for abdominal pain - Normal colon - Medium hemorrhoids  Past Medical History:  Diagnosis Date   Benign intracranial hypertension    Chronic daily headache    Deaf    Injury of flexor tendon of hand 12/19/2013   right ring and small fingers   Insomnia    Migraine, unspecified, not intractable, without status migrainosus    Postoperative nausea and vomiting     Past Surgical History:  Procedure Laterality Date   BUNIONECTOMY Left    CHOLECYSTECTOMY  11/01/2011   Procedure: LAPAROSCOPIC CHOLECYSTECTOMY;  Surgeon: Atilano Ina, MD;  Location: [facility];  Service: General;  Laterality: N/A;  with intraoperative cholangiogram   HAND SURGERY     KNEE SURGERY Right    TENDON REPAIR Right 12/29/2013   Procedure: FLEXOR TENDON REPAIR  RIGHT RING FINGER AND RIGHT SMALL FINGER;  Surgeon: Knute Neu, MD;  Location: [facility];  Service: Plastics;  Laterality: Right;   TONSILLECTOMY AND ADENOIDECTOMY  2001   WISDOM TOOTH EXTRACTION  03/26/2011    Current Outpatient Medications  Medication Sig Dispense Refill   acetaZOLAMIDE (DIAMOX) 250 MG tablet Take 1 tablet (250 mg total) by mouth 2 (two) times daily. 60 tablet 8   oseltamivir (TAMIFLU) 75 MG capsule Take 1 capsule (75 mg total) by mouth every 12 (twelve) hours. 10 capsule 0   No current facility-administered medications for this visit.    Allergies as of 08/13/2023   (No Known Allergies)    Family History  Problem Relation Age of Onset   Hypertension Mother    Hypertension Maternal Grandmother    Diabetes Maternal Grandmother    Migraines Neg Hx    Headache Neg Hx     Social History   Socioeconomic History   Marital status: Single    Spouse name: Not on file   Number of children: Not on file   Years of education: Not on file   Highest education level: Not on  file  Occupational History   Not on file  Tobacco Use   Smoking status: Former    Types: Cigarettes   Smokeless tobacco: Never   Tobacco comments:    2-3 cig./day  Vaping Use   Vaping status: Never Used  Substance and Sexual Activity   Alcohol use: Yes    Alcohol/week: 0.0 standard drinks of alcohol    Comment: occ   Drug use: Yes    Types: Marijuana   Sexual activity: Never  Other Topics Concern   Not on file  Social History Narrative   Not on file   Social Determinants of Health   Financial Resource Strain: Low Risk  (03/04/2023)   Received from [facility], [facility]   Overall Financial Resource Strain (CARDIA)    Difficulty of Paying Living Expenses: Not hard at all  Food Insecurity: Unknown (03/04/2023)   Received from [facility], [facility]   Hunger Vital Sign    Worried About Running Out of Food in the Last Year: Patient declined    Ran Out of Food in the Last Year: Never true  Transportation Needs: No Transportation Needs (03/04/2023)   Received from [employer], [facility]   PRAPARE - Transportation    Lack of Transportation (Medical): No    Lack of Transportation (Non-Medical): No  Physical Activity: Insufficiently Active (03/04/2023)   Received from [facility], [facility]   Exercise Vital Sign    Days of Exercise per Week: 2 days    Minutes of Exercise per Session: 30 min  Stress: Stress Concern Present (03/04/2023)   Received from [facility], [facility] of Occupational Health - Occupational Stress Questionnaire    Feeling of Stress : To some extent  Social Connections: Moderately Integrated (03/04/2023)   Received from [facility], [facility]   Social Network    How would you rate your social network (family, work, friends)?: Adequate participation with social networks  Intimate Partner Violence: Not At Risk (03/04/2023)   Received from [facility], [facility]   HITS    Over the last 12 months how  often did your partner physically hurt you?: 1    Over the last 12 months how often did your partner insult you or talk down to you?: 1    Over the last 12 months how often did your partner threaten you with physical harm?: 1    Over the last 12 months how often did your partner scream or curse at you?: 1    Review of Systems:    Constitutional: No weight loss, fever, chills, weakness or fatigue HEENT: Eyes: No change in vision               Ears, Nose, Throat:  No change in hearing or congestion Skin: No rash or itching Cardiovascular: No chest pain, chest pressure or palpitations   Respiratory: No SOB or cough Gastrointestinal: See HPI and otherwise negative Genitourinary: No dysuria or change in urinary frequency Neurological: No headache, dizziness or syncope  Musculoskeletal: No new muscle or joint pain Hematologic: No bleeding or bruising Psychiatric: No history of depression or anxiety    Physical Exam:  Vital signs: Ht 5\' 7"  (1.702 m)   Wt 136.1 kg   BMI 46.99 kg/m   Constitutional: NAD, Well developed, Well nourished, alert and cooperative Head:  Normocephalic and atraumatic. Eyes:   PEERL, EOMI. No icterus. Conjunctiva pink. Respiratory: Respirations even and unlabored. Lungs clear to auscultation bilaterally.   No wheezes, crackles, or rhonchi.  Cardiovascular:  Regular rate and rhythm. No peripheral edema, cyanosis or pallor.  Gastrointestinal:  Soft, nondistended, nontender. No rebound or guarding. Normal bowel sounds. No appreciable masses or hepatomegaly. Rectal:  Not performed.  Msk:  Symmetrical without gross deformities. Without edema, no deformity or joint abnormality.  Neurologic:  Alert and  oriented x4;  grossly normal neurologically.  Skin:   Dry and intact without significant lesions or rashes. Psychiatric: Oriented to person, place and time. Demonstrates good judgement and reason without abnormal affect or behaviors.   RELEVANT LABS AND IMAGING: CBC     Component Value Date/Time   WBC 4.7 02/18/2020 1031   RBC 4.36 02/18/2020 1031   HGB 13.5 02/18/2020 1031   HCT 41.1 02/18/2020 1031   PLT 208 02/18/2020 1031   MCV 94.3 02/18/2020 1031   MCH 31.0 02/18/2020 1031   MCHC 32.8 02/18/2020 1031   RDW 13.1 02/18/2020 1031   LYMPHSABS 2.3 06/17/2016 0842   MONOABS 0.3 06/17/2016 0842   EOSABS 0.5 06/17/2016 0842   BASOSABS 0.0 06/17/2016 0842    CMP     Component Value Date/Time   NA 138 02/18/2020 0817   K 4.4 02/18/2020 0817   CL 108 02/18/2020 0817   CO2 22 02/18/2020 0817   GLUCOSE 103 (H) 02/18/2020 0817   BUN 15 02/18/2020 0817   CREATININE 1.15 (H) 02/18/2020 0817   CREATININE 0.99 12/11/2011 1204   CALCIUM 8.7 (L) 02/18/2020 0817   PROT 7.4 02/18/2020 0817   ALBUMIN 4.3 02/18/2020 0817   AST 17 02/18/2020 0817   ALT 15 02/18/2020 0817   ALKPHOS 48 02/18/2020 0817   BILITOT 0.8 02/18/2020 0817   GFRNONAA >60 02/18/2020 0817   GFRAA >60 02/18/2020 0817     Assessment/Plan:   Nausea and vomiting, unspecified vomiting type Likely multifactorial but with symptoms worsening with beef there is a possibility of alpha gal.  Also with daily marijuana use cannot rule out cannabinoid hyperemesis syndrome.  On high-dose NSAID so could be a component of PUD as well - EGD for further evaluation - Alpha gal lab studies - I thoroughly discussed the procedure with the patient (at bedside) to include nature of the procedure, alternatives, benefits, and risks (including but not limited to bleeding, infection, perforation, anesthesia/cardiac pulmonary complications).  Patient verbalized understanding and gave verbal consent to proceed with procedure.   Rectal bleeding Loose stools Rectal bleeding possibly hemorrhoidal.  However, with combination of loose stools and rectal bleeding as well as CT scan in the past with possible enhancement in the jejunum could possibly be IBD.  Patient declines stool study today (fecal calprotectin). -  Colonoscopy for further evaluation - If colonoscopy shows internal hemorrhoids amenable to banding can set up appointment after colonoscopy - CBC, CMP, TSH - reassuring this only occurs once yearly. Last episode was 1-2 months ago.  Discussed importance of continuity of care with patient and she elects to do official transfer of care to [facility] from Dr. Haywood Pao office  [facility]  Jolee Ewing [facility] 08/13/2023, 1:59 PM  Cc: [place], O'Laf, PA-C

## 2023-08-13 NOTE — Patient Instructions (Signed)
Your provider has requested that you go to the basement level for lab work before leaving today. Press "B" on the elevator. The lab is located at the first door on the left as you exit the elevator.  You have been scheduled for an endoscopy and colonoscopy. Please follow the written instructions given to you at your visit today.  Please pick up your prep supplies at the pharmacy within the next 1-3 days.  If you use inhalers (even only as needed), please bring them with you on the day of your procedure.  DO NOT TAKE 7 DAYS PRIOR TO TEST- Trulicity (dulaglutide) Ozempic, Wegovy (semaglutide) Mounjaro (tirzepatide) Bydureon Bcise (exanatide extended release)  DO NOT TAKE 1 DAY PRIOR TO YOUR TEST Rybelsus (semaglutide) Adlyxin (lixisenatide) Victoza (liraglutide) Byetta (exanatide) _____________________________________________________________________  We have given you a low-fodmap diet to follow.   We will obtain your records from your previous gastroenterologist.   _______________________________________________________  If your blood pressure at your visit was 140/90 or greater, please contact your primary care physician to follow up on this.  _______________________________________________________  If you are age 55 or older, your body mass index should be between 23-30. Your Body mass index is 46.99 kg/m. If this is out of the aforementioned range listed, please consider follow up with your Primary Care Provider.  If you are age 35 or younger, your body mass index should be between 19-25. Your Body mass index is 46.99 kg/m. If this is out of the aformentioned range listed, please consider follow up with your Primary Care Provider.   ________________________________________________________  The [facility] providers would like to encourage you to use [facility] to communicate with providers for non-urgent requests or questions.  Due to long hold times on the telephone, sending  your provider a message by [facility] may be a faster and more efficient way to get a response.  Please allow 48 business hours for a response.  Please remember that this is for non-urgent requests.  _______________________________________________________

## 2023-08-18 LAB — ALPHA-GAL PANEL
Allergen, Mutton, f88: <0.1 kU/L
Allergen, Pork, f26: <0.1 kU/L
Beef: <0.1 kU/L
CLASS: 0
CLASS: 0
Class: 0
GALACTOSE-ALPHA-1,3-GALACTOSE IGE*: <0.1 kU/L (ref <0.10)

## 2023-08-18 LAB — INTERPRETATION:

## 2023-09-10 ENCOUNTER — Ambulatory Visit: Payer: Medicare Other | Admitting: Gastroenterology

## 2023-09-10 ENCOUNTER — Encounter: Payer: Self-pay | Admitting: Gastroenterology

## 2023-09-10 VITALS — BP 154/97 | HR 58 | Temp 98.2°F | Resp 12 | Ht 67.0 in | Wt 300.0 lb

## 2023-09-10 DIAGNOSIS — K2100 Gastro-esophageal reflux disease with esophagitis, without bleeding: Secondary | ICD-10-CM | POA: Diagnosis not present

## 2023-09-10 DIAGNOSIS — R110 Nausea: Secondary | ICD-10-CM | POA: Diagnosis not present

## 2023-09-10 DIAGNOSIS — K625 Hemorrhage of anus and rectum: Secondary | ICD-10-CM

## 2023-09-10 DIAGNOSIS — K219 Gastro-esophageal reflux disease without esophagitis: Secondary | ICD-10-CM

## 2023-09-10 DIAGNOSIS — K648 Other hemorrhoids: Secondary | ICD-10-CM

## 2023-09-10 DIAGNOSIS — K6289 Other specified diseases of anus and rectum: Secondary | ICD-10-CM | POA: Diagnosis not present

## 2023-09-10 DIAGNOSIS — K644 Residual hemorrhoidal skin tags: Secondary | ICD-10-CM

## 2023-09-10 DIAGNOSIS — R112 Nausea with vomiting, unspecified: Secondary | ICD-10-CM

## 2023-09-10 MED ORDER — HYDROCORTISONE ACETATE 25 MG RE SUPP: 25.0000 mg | Freq: Every day | RECTAL | 0 refills | Status: DC

## 2023-09-10 MED ORDER — PANTOPRAZOLE SODIUM 40 MG PO TBEC: 40.0000 mg | DELAYED_RELEASE_TABLET | Freq: Every day | ORAL | 3 refills | Status: DC

## 2023-09-10 MED ORDER — SODIUM CHLORIDE 0.9 % IV SOLN: 500.0000 mL | Freq: Once | INTRAVENOUS | Status: DC

## 2023-09-10 NOTE — Op Note (Signed)
[facility] Patient Name: Patricia Sanchez Procedure Date: 09/10/2023 2:57 PM MRN: [id number] Endoscopist: [name] , MD, [id number] Age: 32 Referring MD:  Date of Birth: July 15, 1991 Gender: Female Account #: [id number] Procedure:                Colonoscopy Indications:              Evaluation of unexplained GI bleeding presenting                            with Hematochezia Medicines:                Monitored Anesthesia Care Procedure:                Pre-Anesthesia Assessment:                           - Prior to the procedure, a History and Physical                            was performed, and patient medications and                            allergies were reviewed. The patient's tolerance of                            previous anesthesia was also reviewed. The risks                            and benefits of the procedure and the sedation                            options and risks were discussed with the patient.                            All questions were answered, and informed consent                            was obtained. Prior Anticoagulants: The patient has                            taken no anticoagulant or antiplatelet agents. ASA                            Grade Assessment: II - A patient with mild systemic                            disease. After reviewing the risks and benefits,                            the patient was deemed in satisfactory condition to                            undergo the procedure.  After obtaining informed consent, the colonoscope                            was passed under direct vision. Throughout the                            procedure, the patient's blood pressure, pulse, and                            oxygen saturations were monitored continuously. The                            Olympus Scope [id number] was introduced through the                            anus and advanced to the  the cecum, identified by                            appendiceal orifice and ileocecal valve. The                            colonoscopy was performed without difficulty. The                            patient tolerated the procedure well. The quality                            of the bowel preparation was good. The ileocecal                            valve, appendiceal orifice, and rectum were                            photographed. Scope In: 3:11:46 PM Scope Out: 3:31:37 PM Scope Withdrawal Time: 0 hours 11 minutes 1 second  Total Procedure Duration: 0 hours 19 minutes 51 seconds  Findings:                 The perianal and digital rectal examinations were                            normal.                           A patchy area of mildly erythematous friable mucosa                            was found in the rectum. Biopsies were taken with a                            cold forceps for histology.                           The exam was otherwise normal throughout the  examined colon.                           Non-bleeding external and internal hemorrhoids were                            found during retroflexion. The hemorrhoids were                            small. Complications:            No immediate complications. Estimated Blood Loss:     Estimated blood loss was minimal. Impression:               - Erythematous mucosa in the rectum. Biopsied.                           - Non-bleeding external and internal hemorrhoids. Recommendation:           - Resume previous diet.                           - Continue present medications.                           - Await pathology results.                           - Use Benefiber one teaspoon PO TID.                           - Use hydrocortisone suppository 25 mg 1 per rectum                            once a day for 7 days. [name], MD 09/10/2023 3:42:48 PM This report has been signed  electronically.

## 2023-09-10 NOTE — Op Note (Signed)
[facility] Patient Name: Patricia Sanchez Procedure Date: 09/10/2023 2:58 PM MRN: [id number] Endoscopist: [name] , MD, [id number] Age: 32 Referring MD:  Date of Birth: [birth year] Gender: Female Account #: [id number] Procedure:                Upper GI endoscopy Indications:              Nausea with vomiting Medicines:                Monitored Anesthesia Care Procedure:                Pre-Anesthesia Assessment:                           - Prior to the procedure, a History and Physical                            was performed, and patient medications and                            allergies were reviewed. The patient's tolerance of                            previous anesthesia was also reviewed. The risks                            and benefits of the procedure and the sedation                            options and risks were discussed with the patient.                            All questions were answered, and informed consent                            was obtained. Prior Anticoagulants: The patient has                            taken no anticoagulant or antiplatelet agents. ASA                            Grade Assessment: II - A patient with mild systemic                            disease. After reviewing the risks and benefits,                            the patient was deemed in satisfactory condition to                            undergo the procedure.                           After obtaining informed consent, the endoscope was  passed under direct vision. Throughout the                            procedure, the patient's blood pressure, pulse, and                            oxygen saturations were monitored continuously. The                            Olympus Scope [id number] was introduced through the                            mouth, and advanced to the second part of duodenum.                            The upper GI  endoscopy was accomplished without                            difficulty. The patient tolerated the procedure                            well. Scope In: Scope Out: Findings:                 LA Grade C (one or more mucosal breaks continuous                            between tops of 2 or more mucosal folds, less than                            75% circumference) esophagitis with no bleeding was                            found 39 to 40 cm from the incisors.                           The gastroesophageal flap valve was visualized                            endoscopically and classified as Hill Grade III                            (minimal fold, loose to endoscope, hiatal hernia                            likely). Noted free reflux into the reflux                           The stomach was normal.                           The cardia and gastric fundus were normal on  retroflexion.                           The examined duodenum was normal. Complications:            No immediate complications. Estimated Blood Loss:     Estimated blood loss: none. Impression:               - LA Grade C reflux esophagitis with no bleeding.                           - Gastroesophageal flap valve classified as Hill                            Grade III (minimal fold, loose to endoscope, hiatal                            hernia likely).                           - Normal stomach.                           - Normal examined duodenum.                           - No specimens collected. Recommendation:           - Resume previous diet.                           - Continue present medications.                           - Follow an antireflux regimen.                           - Use Protonix (pantoprazole) 40 mg PO daily. [name], MD 09/10/2023 3:45:56 PM This report has been signed electronically.

## 2023-09-10 NOTE — Progress Notes (Unsigned)
Called to room to assist during endoscopic procedure.  Patient ID and intended procedure confirmed with present staff. Received instructions for my participation in the procedure from the performing physician.  

## 2023-09-10 NOTE — Progress Notes (Unsigned)
Pt admitted with assistance from Marilynne Drivers., sign language interpreter.

## 2023-09-10 NOTE — Patient Instructions (Addendum)
- Resume previous diet. - Continue present medications. - Follow an antireflux regimen. - Use Protonix (pantoprazole) 40 mg PO daily. - Await pathology results. - Use Benefiber one teaspoon PO TID. - Use hydrocortisone suppository 25 mg 1 per rectum once a day for 7 days.   YOU HAD AN ENDOSCOPIC PROCEDURE TODAY AT THE [facility]:   Refer to the procedure report that was given to you for any specific questions about what was found during the examination.  If the procedure report does not answer your questions, please call your gastroenterologist to clarify.  If you requested that your care partner not be given the details of your procedure findings, then the procedure report has been included in a sealed envelope for you to review at your convenience later.  YOU SHOULD EXPECT: Some feelings of bloating in the abdomen. Passage of more gas than usual.  Walking can help get rid of the air that was put into your GI tract during the procedure and reduce the bloating. If you had a lower endoscopy (such as a colonoscopy or flexible sigmoidoscopy) you may notice spotting of blood in your stool or on the toilet paper. If you underwent a bowel prep for your procedure, you may not have a normal bowel movement for a few days.  Please Note:  You might notice some irritation and congestion in your nose or some drainage.  This is from the oxygen used during your procedure.  There is no need for concern and it should clear up in a day or so.  SYMPTOMS TO REPORT IMMEDIATELY:  Following lower endoscopy (colonoscopy or flexible sigmoidoscopy):  Excessive amounts of blood in the stool  Significant tenderness or worsening of abdominal pains  Swelling of the abdomen that is new, acute  Fever of 100F or higher  Following upper endoscopy (EGD)  Vomiting of blood or coffee ground material  New chest pain or pain under the shoulder blades  Painful or persistently difficult swallowing  New shortness of  breath  Fever of 100F or higher  Black, tarry-looking stools  For urgent or emergent issues, a gastroenterologist can be reached at any hour by calling [phone]. Do not use MyChart messaging for urgent concerns.    DIET:  We do recommend a small meal at first, but then you may proceed to your regular diet.  Drink plenty of fluids but you should avoid alcoholic beverages for 24 hours.  ACTIVITY:  You should plan to take it easy for the rest of today and you should NOT DRIVE or use heavy machinery until tomorrow (because of the sedation medicines used during the test).    FOLLOW UP: Our staff will call the number listed on your records the next business day following your procedure.  We will call around 7:15- 8:00 am to check on you and address any questions or concerns that you may have regarding the information given to you following your procedure. If we do not reach you, we will leave a message.     If any biopsies were taken you will be contacted by phone or by letter within the next 1-3 weeks.  Please call us at [phone] if you have not heard about the biopsies in 3 weeks.    SIGNATURES/CONFIDENTIALITY: You and/or your care partner have signed paperwork which will be entered into your electronic medical record.  These signatures attest to the fact that that the information above on your After Visit Summary has been reviewed and  is understood.  Full responsibility of the confidentiality of this discharge information lies with you and/or your care-partner.

## 2023-09-10 NOTE — Progress Notes (Unsigned)
Sedate, gd SR, tolerated procedure well, VSS, report to RN 

## 2023-09-10 NOTE — Progress Notes (Unsigned)
[facility] History and Physical   Primary Care Physician:  Reather Converse, PA-C   Reason for Procedure:  Nausea, vomiting, rectal bleeding  Plan:    EGD and colonoscopy with possible interventions as needed     HPI: Patricia Sanchez is a very pleasant 32 y.o. adult here for EGD and colonoscopy for evaluation of nausea, vomiting and rectal bleeding.  Please refer to office visit by Boone Master 08/13/23 The risks and benefits as well as alternatives of endoscopic procedure(s) have been discussed and reviewed. All questions answered. The patient agrees to proceed.    Past Medical History:  Diagnosis Date   Anxiety    Arthritis    Benign intracranial hypertension    Chronic daily headache    Deaf    Hypertension    Injury of flexor tendon of hand 12/19/2013   right ring and small fingers   Insomnia    Migraine, unspecified, not intractable, without status migrainosus    Postoperative nausea and vomiting     Past Surgical History:  Procedure Laterality Date   BUNIONECTOMY Left    CHOLECYSTECTOMY  11/01/2011   Procedure: LAPAROSCOPIC CHOLECYSTECTOMY;  Surgeon: Atilano Ina, MD;  Location: [facility];  Service: General;  Laterality: N/A;  with intraoperative cholangiogram   HAND SURGERY     KNEE SURGERY Right    TENDON REPAIR Right 12/29/2013   Procedure: FLEXOR TENDON REPAIR  RIGHT RING FINGER AND RIGHT SMALL FINGER;  Surgeon: Knute Neu, MD;  Location:  SURGERY CENTER;  Service: Plastics;  Laterality: Right;   TONSILLECTOMY AND ADENOIDECTOMY  2001   WISDOM TOOTH EXTRACTION  03/26/2011    Prior to Admission medications   Medication Sig Start Date End Date Taking? Authorizing Provider  acetaZOLAMIDE (DIAMOX) 250 MG tablet Take 1 tablet (250 mg total) by mouth 2 (two) times daily. 02/06/23   Ocie Doyne, MD  Docusate Sodium (DSS) 100 MG CAPS TAKE 1 CAPSULE BY MOUTH EVERY DAY AS DIRECTED FOR STOOL SOFTENER    [provider]   famotidine (PEPCID) 20 MG tablet Take 20 mg by mouth daily.    [provider]  LOMAIRA 8 MG TABS Take 1 tablet by mouth daily.    [provider]  metFORMIN (GLUCOPHAGE) 500 MG tablet Take 500 mg by mouth daily with breakfast. Patient not taking: Reported on 09/10/2023 03/25/23   [provider]  naproxen (NAPROSYN) 500 MG tablet Take 500 mg by mouth 2 (two) times daily as needed.    [provider]    Current Outpatient Medications  Medication Sig Dispense Refill   acetaZOLAMIDE (DIAMOX) 250 MG tablet Take 1 tablet (250 mg total) by mouth 2 (two) times daily. 60 tablet 8   Docusate Sodium (DSS) 100 MG CAPS TAKE 1 CAPSULE BY MOUTH EVERY DAY AS DIRECTED FOR STOOL SOFTENER     famotidine (PEPCID) 20 MG tablet Take 20 mg by mouth daily.     LOMAIRA 8 MG TABS Take 1 tablet by mouth daily.     metFORMIN (GLUCOPHAGE) 500 MG tablet Take 500 mg by mouth daily with breakfast. (Patient not taking: Reported on 09/10/2023)     naproxen (NAPROSYN) 500 MG tablet Take 500 mg by mouth 2 (two) times daily as needed.     Current Facility-Administered Medications  Medication Dose Route Frequency Provider Last Rate Last Admin   0.9 %  sodium chloride infusion  500 mL Intravenous Once Tyneshia Stivers, Eleonore Chiquito, MD        Allergies  as of 09/10/2023   (No Known Allergies)    Family History  Problem Relation Age of Onset   Hypertension Mother    Hypertension Maternal Grandmother    Diabetes Maternal Grandmother    Migraines Neg Hx    Headache Neg Hx    Colon cancer Neg Hx    Colon polyps Neg Hx    Esophageal cancer Neg Hx    Rectal cancer Neg Hx    Stomach cancer Neg Hx     Social History   Socioeconomic History   Marital status: Single    Spouse name: Not on file   Number of children: Not on file   Years of education: Not on file   Highest education level: Not on file  Occupational History   Not on file  Tobacco Use   Smoking status: Never   Smokeless  tobacco: Never  Vaping Use   Vaping status: Never Used  Substance and Sexual Activity   Alcohol use: Yes    Alcohol/week: 0.0 standard drinks of alcohol    Comment: occ   Drug use: Yes    Types: Marijuana   Sexual activity: Never  Other Topics Concern   Not on file  Social History Narrative   Not on file   Social Determinants of Health   Financial Resource Strain: Low Risk  (03/04/2023)   Received from [facility], [facility]   Overall Financial Resource Strain (CARDIA)    Difficulty of Paying Living Expenses: Not hard at all  Food Insecurity: Unknown (03/04/2023)   Received from [facility], [facility]   Hunger Vital Sign    Worried About Running Out of Food in the Last Year: Patient declined    Ran Out of Food in the Last Year: Never true  Transportation Needs: No Transportation Needs (03/04/2023)   Received from [employer], [facility]   PRAPARE - Transportation    Lack of Transportation (Medical): No    Lack of Transportation (Non-Medical): No  Physical Activity: Insufficiently Active (03/04/2023)   Received from [facility], [facility]   Exercise Vital Sign    Days of Exercise per Week: 2 days    Minutes of Exercise per Session: 30 min  Stress: Stress Concern Present (03/04/2023)   Received from [facility], [facility] - Occupational Stress Questionnaire    Feeling of Stress : To some extent  Social Connections: Moderately Integrated (03/04/2023)   Received from [facility], [facility]   Social Network    How would you rate your social network (family, work, friends)?: Adequate participation with social networks  Intimate Partner Violence: Not At Risk (03/04/2023)   Received from [facility], [facility]   HITS    Over the last 12 months how often did your partner physically hurt you?: Never    Over the last 12 months how often did your partner insult you or talk down to you?: Never    Over the  last 12 months how often did your partner threaten you with physical harm?: Never    Over the last 12 months how often did your partner scream or curse at you?: Never    Review of Systems:  All other review of systems negative except as mentioned in the HPI.  Physical Exam: Vital signs in last 24 hours: BP (!) 148/99   Pulse 70   Temp 98.2 F (36.8 C) (Temporal)   Ht 5\' 7"  (1.702 m)   Wt  300 lb (136.1 kg)   SpO2 99%   BMI 46.99 kg/m  General:   Alert, NAD Lungs:  Clear .   Heart:  Regular rate and rhythm Abdomen:  Soft, nontender and nondistended. Neuro/Psych:  Alert and cooperative. Normal mood and affect. A and O x 3  Reviewed labs, radiology imaging, old records and pertinent past GI work up  Patient is appropriate for planned procedure(s) and anesthesia in an ambulatory setting   K. Scherry Ran , MD [phone]

## 2023-09-11 ENCOUNTER — Telehealth: Payer: Self-pay

## 2023-09-11 NOTE — Telephone Encounter (Signed)
Unable to reach anyone.

## 2023-09-15 ENCOUNTER — Telehealth: Payer: Self-pay | Admitting: Gastroenterology

## 2023-09-15 LAB — SURGICAL PATHOLOGY

## 2023-09-15 NOTE — Telephone Encounter (Signed)
Inbound call from patient requesting a call to discuss 11/20 colonoscopy results. Please advise, thank you.

## 2023-09-16 NOTE — Telephone Encounter (Signed)
Message sent to the patient using her telephonic service. The doctor will review her results. She will then be contacted with the results and any recommendations.

## 2023-09-30 ENCOUNTER — Other Ambulatory Visit: Payer: Self-pay | Admitting: *Deleted

## 2023-09-30 NOTE — Telephone Encounter (Signed)
Last seen on 02/06/23  per note " They have a follow up with ophthalmology scheduled for next month. May consider weaning Diamox if papilledema has resolved at that visit. "  No follow up scheduled

## 2023-10-02 ENCOUNTER — Encounter: Payer: Self-pay | Admitting: Gastroenterology

## 2023-10-13 ENCOUNTER — Ambulatory Visit: Payer: Medicare Other | Admitting: Psychiatry

## 2023-12-03 ENCOUNTER — Ambulatory Visit: Payer: Medicare Other | Admitting: Obstetrics and Gynecology

## 2023-12-03 ENCOUNTER — Other Ambulatory Visit (HOSPITAL_COMMUNITY)
Admission: RE | Admit: 2023-12-03 | Discharge: 2023-12-03 | Disposition: A | Discharge status: Home / Self Care | Payer: Medicare Other | Source: Ambulatory Visit | Attending: Obstetrics and Gynecology | Admitting: Obstetrics and Gynecology

## 2023-12-03 ENCOUNTER — Encounter: Payer: Self-pay | Admitting: Obstetrics and Gynecology

## 2023-12-03 VITALS — BP 112/64 | HR 66 | Temp 97.5°F | Ht 71.0 in | Wt 287.0 lb

## 2023-12-03 DIAGNOSIS — N898 Other specified noninflammatory disorders of vagina: Secondary | ICD-10-CM

## 2023-12-03 DIAGNOSIS — Z1151 Encounter for screening for human papillomavirus (HPV): Secondary | ICD-10-CM | POA: Diagnosis not present

## 2023-12-03 DIAGNOSIS — Z01411 Encounter for gynecological examination (general) (routine) with abnormal findings: Secondary | ICD-10-CM | POA: Diagnosis not present

## 2023-12-03 DIAGNOSIS — Z124 Encounter for screening for malignant neoplasm of cervix: Secondary | ICD-10-CM | POA: Diagnosis not present

## 2023-12-03 DIAGNOSIS — Z9189 Other specified personal risk factors, not elsewhere classified: Secondary | ICD-10-CM

## 2023-12-03 DIAGNOSIS — L680 Hirsutism: Secondary | ICD-10-CM | POA: Diagnosis not present

## 2023-12-03 DIAGNOSIS — R87610 Atypical squamous cells of undetermined significance on cytologic smear of cervix (ASC-US): Secondary | ICD-10-CM | POA: Diagnosis not present

## 2023-12-03 DIAGNOSIS — Z01419 Encounter for gynecological examination (general) (routine) without abnormal findings: Secondary | ICD-10-CM | POA: Diagnosis present

## 2023-12-03 LAB — WET PREP FOR TRICH, YEAST, CLUE

## 2023-12-03 NOTE — Progress Notes (Signed)
33 y.o. G0 female with bilateral hearing loss here for annual exam. Single. Due to language barrier, an interpreter was present during the history-taking and subsequent discussion (and for part of the physical exam) with this patient.  Concerns with PCOS because of hair growth on chin. Coparents with female ex-partner, 2 children. Had via IUI. Interested in similar process for themself. Lost 30lb over last year with diet and exercise.  Patient's last menstrual period was 11/26/2023 (approximate). Period Duration (Days): 7 Period Pattern: Regular Menstrual Flow: Moderate Menstrual Control: Tampon Dysmenorrhea: (!) Mild  Abnormal bleeding: none Pelvic discharge or pain: none Breast mass, nipple discharge or skin changes : none Birth control: none Last PAP: No results found for: "DIAGPAP", "HPVHIGH", "ADEQPAP" Gardasil: unknown Sexually active: yes  Exercising: walks Smoker: no  GYN HISTORY: No significant history  OB History  No obstetric history on file.    Past Medical History:  Diagnosis Date   Anxiety    Arthritis    Benign intracranial hypertension    Chronic daily headache    Deaf    Hypertension    Injury of flexor tendon of hand 12/19/2013   right ring and small fingers   Insomnia    Migraine, unspecified, not intractable, without status migrainosus    Postoperative nausea and vomiting     Past Surgical History:  Procedure Laterality Date   BUNIONECTOMY Left    CHOLECYSTECTOMY  11/01/2011   Procedure: LAPAROSCOPIC CHOLECYSTECTOMY;  Surgeon: Atilano Ina, MD;  Location: [facility];  Service: General;  Laterality: N/A;  with intraoperative cholangiogram   HAND SURGERY     KNEE SURGERY Right    TENDON REPAIR Right 12/29/2013   Procedure: FLEXOR TENDON REPAIR  RIGHT RING FINGER AND RIGHT SMALL FINGER;  Surgeon: Knute Neu, MD;  Location:  SUR[facility]ERY CENTER;  Service: Plastics;  Laterality: Right;   TONSILLECTOMY AND ADENOIDECTOMY  2001   WISDOM TOOTH  EXTRACTION  03/26/2011    Current Outpatient Medications on File Prior to Visit  Medication Sig Dispense Refill   acetaZOLAMIDE (DIAMOX) 250 MG tablet Take 1 tablet (250 mg total) by mouth 2 (two) times daily. 60 tablet 8   amLODipine (NORVASC) 5 MG tablet Take 5 mg by mouth daily.     metFORMIN (GLUCOPHAGE) 500 MG tablet Take 500 mg by mouth daily with breakfast.     naproxen (NAPROSYN) 500 MG tablet Take 500 mg by mouth 2 (two) times daily as needed.     phentermine 15 MG capsule Take 15 mg by mouth daily.     No current facility-administered medications on file prior to visit.    Social History   Socioeconomic History   Marital status: Single    Spouse name: Not on file   Number of children: Not on file   Years of education: Not on file   Highest education level: Not on file  Occupational History   Not on file  Tobacco Use   Smoking status: Never   Smokeless tobacco: Never  Vaping Use   Vaping status: Never Used  Substance and Sexual Activity   Alcohol use: Yes    Alcohol/week: 0.0 standard drinks of alcohol    Comment: occ   Drug use: Yes    Types: Marijuana   Sexual activity: Never    Birth control/protection: None  Other Topics Concern   Not on file  Social History Narrative   Not on file   Social Drivers of Health   Financial Resource Strain:  Low Risk  (03/04/2023)   Received from [facility], [facility]   Overall Financial Resource Strain (CARDIA)    Difficulty of Paying Living Expenses: Not hard at all  Food Insecurity: Unknown (03/04/2023)   Received from [facility], [facility]   Hunger Vital Sign    Worried About Running Out of Food in the Last Year: Patient declined    Ran Out of Food in the Last Year: Never true  Transportation Needs: No Transportation Needs (03/04/2023)   Received from [facility], [facility]   PRAPARE - Transportation    Lack of Transportation (Medical): No    Lack of Transportation (Non-Medical): No  Physical  Activity: Insufficiently Active (03/04/2023)   Received from [facility], [facility]   Exercise Vital Sign    Days of Exercise per Week: 2 days    Minutes of Exercise per Session: 30 min  Stress: Stress Concern Present (03/04/2023)   Received from [facility], [facility] - Occupational Stress Questionnaire    Feeling of Stress : To some extent  Social Connections: Moderately Integrated (03/04/2023)   Received from [facility], [facility]   Social Network    How would you rate your social network (family, work, friends)?: Adequate participation with social networks  Intimate Partner Violence: Not At Risk (03/04/2023)   Received from [facility], [facility]   HITS    Over the last 12 months how often did your partner physically hurt you?: Never    Over the last 12 months how often did your partner insult you or talk down to you?: Never    Over the last 12 months how often did your partner threaten you with physical harm?: Never    Over the last 12 months how often did your partner scream or curse at you?: Never    Family History  Problem Relation Age of Onset   Hypertension Mother    Hypertension Maternal Grandmother    Diabetes Maternal Grandmother    Migraines Neg Hx    Headache Neg Hx    Colon cancer Neg Hx    Colon polyps Neg Hx    Esophageal cancer Neg Hx    Rectal cancer Neg Hx    Stomach cancer Neg Hx     No Known Allergies    PE Today's Vitals   12/03/23 0937  BP: 112/64  Pulse: 66  Temp: (!) 97.5 F (36.4 C)  TempSrc: Oral  SpO2: 98%  Weight: 287 lb (130.2 kg)  Height: 5\' 11"  (1.803 m)   Body mass index is 40.03 kg/m.  Physical Exam Vitals reviewed. Exam conducted with a chaperone present.  Constitutional:      General: Patricia Sanchez is not in acute distress.    Appearance: Normal appearance.  HENT:     Head: Normocephalic and atraumatic.     Nose: Nose normal.  Eyes:     Extraocular  Movements: Extraocular movements intact.     Conjunctiva/sclera: Conjunctivae normal.  Neck:     Thyroid: No thyroid mass, thyromegaly or thyroid tenderness.  Pulmonary:     Effort: Pulmonary effort is normal.  Chest:     Chest wall: No mass or tenderness.  Breasts:    Right: Normal. No swelling, mass, nipple discharge, skin change or tenderness.     Left: Normal. No swelling, mass, nipple discharge, skin change or tenderness.  Abdominal:     General: There is no distension.  Palpations: Abdomen is soft.     Tenderness: There is no abdominal tenderness.  Genitourinary:    General: Normal vulva.     Exam position: Lithotomy position.     Urethra: No prolapse.     Vagina: Normal. No vaginal discharge or bleeding.     Cervix: Normal. No lesion.     Uterus: Normal. Not enlarged and not tender.      Adnexa: Right adnexa normal and left adnexa normal.  Musculoskeletal:        General: Normal range of motion.     Cervical back: Normal range of motion.  Lymphadenopathy:     Upper Body:     Right upper body: No axillary adenopathy.     Left upper body: No axillary adenopathy.     Lower Body: No right inguinal adenopathy. No left inguinal adenopathy.  Skin:    General: Skin is warm and dry.  Neurological:     General: No focal deficit present.     Mental Status: Patricia Sanchez is alert.  Psychiatric:        Mood and Affect: Mood normal.        Behavior: Behavior normal.       Assessment and Plan:        Well woman exam with routine gynecological exam Assessment & Plan: Cervical cancer screening performed according to ASCCP guidelines. Interested in childbearing, desires IUI information. [facility] pamphlet provided Labs and immunizations with her primary Encouraged safe sexual practices as indicated Encouraged healthy lifestyle practices with diet and exercise For patients under 50yo, I recommend 1000mg  calcium daily and 600IU of vitamin D daily.    Cervical cancer screening -      Cytology - PAP  Hirsutism -     Testosterone  Vaginal discharge -     WET PREP FOR TRICH, YEAST, CLUE    Rosalyn Gess, MD

## 2023-12-03 NOTE — Assessment & Plan Note (Addendum)
Cervical cancer screening performed according to ASCCP guidelines. Interested in childbearing, desires IUI information. [facility] pamphlet provided Labs and immunizations with her primary Encouraged safe sexual practices as indicated Encouraged healthy lifestyle practices with diet and exercise For patients under 35yo, I recommend 1000mg  calcium daily and 600IU of vitamin D daily.

## 2023-12-03 NOTE — Patient Instructions (Signed)

## 2023-12-04 ENCOUNTER — Encounter: Payer: Self-pay | Admitting: Obstetrics and Gynecology

## 2023-12-04 LAB — TESTOSTERONE: Testosterone: 18 ng/dL

## 2023-12-08 LAB — CYTOLOGY - PAP
Comment: NEGATIVE
Diagnosis: UNDETERMINED — AB
High risk HPV: NEGATIVE

## 2024-01-12 ENCOUNTER — Ambulatory Visit
Admission: EM | Admit: 2024-01-12 | Discharge: 2024-01-12 | Disposition: A | Discharge status: Home / Self Care | Attending: Family Medicine | Admitting: Family Medicine

## 2024-01-12 ENCOUNTER — Encounter: Payer: Self-pay | Admitting: Emergency Medicine

## 2024-01-12 ENCOUNTER — Ambulatory Visit (INDEPENDENT_AMBULATORY_CARE_PROVIDER_SITE_OTHER): Admitting: Radiology

## 2024-01-12 DIAGNOSIS — M25562 Pain in left knee: Secondary | ICD-10-CM

## 2024-01-12 DIAGNOSIS — S8992XA Unspecified injury of left lower leg, initial encounter: Secondary | ICD-10-CM

## 2024-01-12 MED ORDER — PREDNISONE 20 MG PO TABS
40.0000 mg | ORAL_TABLET | Freq: Every day | ORAL | 0 refills | Status: AC
Start: 2024-01-12 — End: 2024-01-17

## 2024-01-12 NOTE — ED Triage Notes (Signed)
 Pt presents with left knee pain after she twisted it last night playing kickball

## 2024-01-12 NOTE — ED Provider Notes (Signed)
 [facility]    CSN: [id number] Arrival date & time: 01/12/24  0813      History   Chief Complaint Chief Complaint  Patient presents with   Knee Pain    Sign Language    HPI Patricia Sanchez is a 33 y.o. adult.   Patient here today for evaluation of left knee pain that started after she accidentally twisted her knee playing kickball last night.  She reports that she had been running and stopped abruptly to avoid being hit with the ball and in doing so twisted her knee during the hard stop.  She reports pain since that time and is having trouble bearing weight.  She notes pain is present most to upper anterior knee and lateral joint line.  She does not report numbness or tingling.  The history is provided by the patient. The history is limited by a language barrier. A language interpreter was used (Patricia Sanchez- ASL).    Past Medical History:  Diagnosis Date   Anxiety    Arthritis    Benign intracranial hypertension    Chronic daily headache    Deaf    Hypertension    Injury of flexor tendon of hand 12/19/2013   right ring and small fingers   Insomnia    Migraine, unspecified, not intractable, without status migrainosus    Postoperative nausea and vomiting     Patient Active Problem List   Diagnosis Date Noted   Well woman exam with routine gynecological exam 12/03/2023   Deaf 11/01/2011    Past Surgical History:  Procedure Laterality Date   BUNIONECTOMY Left    CHOLECYSTECTOMY  11/01/2011   Procedure: LAPAROSCOPIC CHOLECYSTECTOMY;  Surgeon: Atilano Ina, MD;  Location: [facility];  Service: General;  Laterality: N/A;  with intraoperative cholangiogram   HAND SURGERY     KNEE SURGERY Right    TENDON REPAIR Right 12/29/2013   Procedure: FLEXOR TENDON REPAIR  RIGHT RING FINGER AND RIGHT SMALL FINGER;  Surgeon: Knute Neu, MD;  Location:  SUR[facility]ERY CENTER;  Service: Plastics;  Laterality: Right;   TONSILLECTOMY AND ADENOIDECTOMY  2001   WISDOM  TOOTH EXTRACTION  03/26/2011    OB History   No obstetric history on file.      Home Medications    Prior to Admission medications   Medication Sig Start Date End Date Taking? Authorizing Provider  predniSONE (DELTASONE) 20 MG tablet Take 2 tablets (40 mg total) by mouth daily with breakfast for 5 days. 01/12/24 01/17/24 Yes Tomi Bamberger, PA-C  acetaZOLAMIDE (DIAMOX) 250 MG tablet Take 1 tablet (250 mg total) by mouth 2 (two) times daily. 02/06/23   Ocie Doyne, MD  amLODipine (NORVASC) 5 MG tablet Take 5 mg by mouth daily. 10/31/23   [provider]  metFORMIN (GLUCOPHAGE) 500 MG tablet Take 500 mg by mouth daily with breakfast. 03/25/23   [provider]  naproxen (NAPROSYN) 500 MG tablet Take 500 mg by mouth 2 (two) times daily as needed.    [provider]  phentermine 15 MG capsule Take 15 mg by mouth daily. 11/21/23   [provider]    Family History Family History  Problem Relation Age of Onset   Hypertension Mother    Hypertension Maternal Grandmother    Diabetes Maternal Grandmother    Migraines Neg Hx    Headache Neg Hx    Colon cancer Neg Hx    Colon polyps Neg Hx    Esophageal cancer Neg Hx  Rectal cancer Neg Hx    Stomach cancer Neg Hx     Social History Social History   Tobacco Use   Smoking status: Never   Smokeless tobacco: Never  Vaping Use   Vaping status: Never Used  Substance Use Topics   Alcohol use: Yes    Alcohol/week: 0.0 standard drinks of alcohol    Comment: occ   Drug use: Yes    Types: Marijuana     Allergies   Patient has no known allergies.   Review of Systems Review of Systems  Constitutional:  Negative for chills and fever.  Eyes:  Negative for discharge and redness.  Respiratory:  Negative for shortness of breath.   Gastrointestinal:  Negative for nausea and vomiting.  Musculoskeletal:  Positive for arthralgias.  Neurological:  Negative for numbness.     Physical Exam Triage  Vital Signs ED Triage Vitals  Encounter Vitals Group     BP      Systolic BP Percentile      Diastolic BP Percentile      Pulse      Resp      Temp      Temp src      SpO2      Weight      Height      Head Circumference      Peak Flow      Pain Score      Pain Loc      Pain Education      Exclude from Growth Chart    No data found.  Updated Vital Signs BP 126/89 (BP Location: Right Arm)   Pulse 78   Temp 98.3 F (36.8 C) (Oral)   Resp 17   SpO2 98%   Visual Acuity Right Eye Distance:   Left Eye Distance:   Bilateral Distance:    Right Eye Near:   Left Eye Near:    Bilateral Near:     Physical Exam Vitals and nursing note reviewed.  Constitutional:      General: [name] is not in acute distress.    Appearance: Normal appearance. [name] is not ill-appearing.  HENT:     Head: Normocephalic and atraumatic.  Eyes:     Conjunctiva/sclera: Conjunctivae normal.  Cardiovascular:     Rate and Rhythm: Normal rate.  Pulmonary:     Effort: Pulmonary effort is normal. No respiratory distress.  Musculoskeletal:     Comments: Decreased range of motion of left knee due to pain.  Mild tenderness noted to upper anterior and lateral joint line.  Neurological:     Mental Status: [name] is alert.  Psychiatric:        Mood and Affect: Mood normal.        Behavior: Behavior normal.        Thought Content: Thought content normal.      UC Treatments / Results  Labs (all labs ordered are listed, but only abnormal results are displayed) Labs Reviewed - No data to display  EKG   Radiology DG Knee Complete 4 Views Left Result Date: 01/12/2024 CLINICAL DATA:  Fall, injury pain EXAM: LEFT KNEE - COMPLETE 4+ VIEW COMPARISON:  None Available. FINDINGS: No evidence of fracture, dislocation, or joint effusion. No evidence of arthropathy or other focal bone abnormality. Soft tissues are unremarkable. IMPRESSION: No acute or significant finding by plain radiography  Electronically Signed   By: Judie Petit.  Shick M.D.   On: 01/12/2024 09:25    Procedures  Procedures (including critical care time)  Medications Ordered in UC Medications - No data to display  Initial Impression / Assessment and Plan / UC Course  I have reviewed the triage vital signs and the nursing notes.  Pertinent labs & imaging results that were available during my care of the patient were reviewed by me and considered in my medical decision making (see chart for details).    X-ray without fracture however concerns for soft tissue injury and recommended further evaluation by Ortho.  Contact information provided for same.  Knee brace patient brought reapplied in office and crutches provided.  Offered new knee brace however patient opts to continue use of knee brace she was given for her right knee.  Patient will plan follow-up with Ortho soon as possible.  Will treat with steroids to hopefully improve inflammation and pain.  Also discussed use of ice and compression for pain relief.  Patient expressed understanding of plan.  Final Clinical Impressions(s) / UC Diagnoses   Final diagnoses:  Left knee injury, initial encounter  Acute pain of left knee   Discharge Instructions   None    ED Prescriptions     Medication Sig Dispense Auth. Provider   predniSONE (DELTASONE) 20 MG tablet Take 2 tablets (40 mg total) by mouth daily with breakfast for 5 days. 10 tablet Tomi Bamberger, PA-C      PDMP not reviewed this encounter.   Tomi Bamberger, PA-C 01/12/24 [phone]

## 2024-02-03 ENCOUNTER — Ambulatory Visit
Admission: EM | Admit: 2024-02-03 | Discharge: 2024-02-03 | Disposition: A | Discharge status: Home / Self Care | Attending: Internal Medicine | Admitting: Internal Medicine

## 2024-02-03 ENCOUNTER — Other Ambulatory Visit: Payer: Self-pay

## 2024-02-03 DIAGNOSIS — B001 Herpesviral vesicular dermatitis: Secondary | ICD-10-CM

## 2024-02-03 MED ORDER — VALACYCLOVIR HCL 1 G PO TABS: 1000.0000 mg | ORAL_TABLET | Freq: Two times a day (BID) | ORAL | 0 refills | Status: AC

## 2024-02-03 NOTE — ED Triage Notes (Signed)
 Translator used for clinical intake. Patricia Sanchez #[id number]  Pt presents with complaints of cold sore x 2 days. Pt currently rates her overall pain a 1/10. Chap stick and neosporin applied with no relief.

## 2024-02-03 NOTE — ED Provider Notes (Signed)
 [facility]    CSN: [id number] Arrival date & time: 02/03/24  0805      History   Chief Complaint Chief Complaint  Patient presents with   Mouth Lesions    HPI Patricia Sanchez is a 33 y.o. adult.   Patient presents with concern of cold sore to right bottom lip that started about 2 days ago.  Reports possible history of the same multiple years ago.  Has used topical medication and Chapstick with no improvement.  Denies any pain to the area.  Denies any associated fever.  Patient denies that they have performed any oral intercourse on sexual partner.   Mouth Lesions   Past Medical History:  Diagnosis Date   Anxiety    Arthritis    Benign intracranial hypertension    Chronic daily headache    Deaf    Hypertension    Injury of flexor tendon of hand 12/19/2013   right ring and small fingers   Insomnia    Migraine, unspecified, not intractable, without status migrainosus    Postoperative nausea and vomiting     Patient Active Problem List   Diagnosis Date Noted   Well woman exam with routine gynecological exam 12/03/2023   Deaf 11/01/2011    Past Surgical History:  Procedure Laterality Date   BUNIONECTOMY Left    CHOLECYSTECTOMY  11/01/2011   Procedure: LAPAROSCOPIC CHOLECYSTECTOMY;  Surgeon: Fran Imus, MD;  Location: [facility];  Service: General;  Laterality: N/A;  with intraoperative cholangiogram   HAND SURGERY     KNEE SURGERY Right    TENDON REPAIR Right 12/29/2013   Procedure: FLEXOR TENDON REPAIR  RIGHT RING FINGER AND RIGHT SMALL FINGER;  Surgeon: Mauricia South, MD;  Location: [facility];  Service: Plastics;  Laterality: Right;   TONSILLECTOMY AND ADENOIDECTOMY  2001   WISDOM TOOTH EXTRACTION  03/26/2011    OB History   No obstetric history on file.      Home Medications    Prior to Admission medications   Medication Sig Start Date End Date Taking? Authorizing Provider  valACYclovir (VALTREX) 1000 MG tablet Take 1  tablet (1,000 mg total) by mouth 2 (two) times daily for 7 days. 02/03/24 02/10/24 Yes Mayre Bury, Dewey Fordyce, FNP  acetaZOLAMIDE (DIAMOX) 250 MG tablet Take 1 tablet (250 mg total) by mouth 2 (two) times daily. 02/06/23   Dala Dublin, MD  amLODipine (NORVASC) 5 MG tablet Take 5 mg by mouth daily. 10/31/23   [provider]  metFORMIN (GLUCOPHAGE) 500 MG tablet Take 500 mg by mouth daily with breakfast. 03/25/23   [provider]  naproxen (NAPROSYN) 500 MG tablet Take 500 mg by mouth 2 (two) times daily as needed.    [provider]  phentermine 15 MG capsule Take 15 mg by mouth daily. 11/21/23   [provider]    Family History Family History  Problem Relation Age of Onset   Hypertension Mother    Hypertension Maternal Grandmother    Diabetes Maternal Grandmother    Migraines Neg Hx    Headache Neg Hx    Colon cancer Neg Hx    Colon polyps Neg Hx    Esophageal cancer Neg Hx    Rectal cancer Neg Hx    Stomach cancer Neg Hx     Social History Social History   Tobacco Use   Smoking status: Never   Smokeless tobacco: Never  Vaping Use   Vaping status: Never Used  Substance Use  Topics   Alcohol use: Yes    Alcohol/week: 0.0 standard drinks of alcohol    Comment: occ   Drug use: Yes    Types: Marijuana     Allergies   Patient has no known allergies.   Review of Systems Review of Systems Per HPI  Physical Exam Triage Vital Signs ED Triage Vitals  Encounter Vitals Group     BP 02/03/24 0819 117/83     Systolic BP Percentile --      Diastolic BP Percentile --      Pulse Rate 02/03/24 0819 75     Resp 02/03/24 0819 18     Temp 02/03/24 0819 98 F (36.7 C)     Temp Source 02/03/24 0819 Oral     SpO2 02/03/24 0819 97 %     Weight 02/03/24 0822 277 lb 11.2 oz (126 kg)     Height --      Head Circumference --      Peak Flow --      Pain Score 02/03/24 0821 1     Pain Loc --      Pain Education --      Exclude from Growth Chart --     No data found.  Updated Vital Signs BP 117/83 (BP Location: Right Arm)   Pulse 75   Temp 98 F (36.7 C) (Oral)   Resp 18   Wt 277 lb 11.2 oz (126 kg)   SpO2 97%   BMI 38.73 kg/m   Visual Acuity Right Eye Distance:   Left Eye Distance:   Bilateral Distance:    Right Eye Near:   Left Eye Near:    Bilateral Near:     Physical Exam Constitutional:      General: [name] is not in acute distress.    Appearance: Normal appearance. [name] is not toxic-appearing or diaphoretic.  HENT:     Head: Normocephalic and atraumatic.     Mouth/Throat:      Comments: Patient has cluster of vesicular lesions present to right lower lip. No drainage noted.  Eyes:     Extraocular Movements: Extraocular movements intact.     Conjunctiva/sclera: Conjunctivae normal.  Pulmonary:     Effort: Pulmonary effort is normal.  Neurological:     General: No focal deficit present.     Mental Status: [name] is alert and oriented to person, place, and time. Mental status is at baseline.  Psychiatric:        Mood and Affect: Mood normal.        Behavior: Behavior normal.        Thought Content: Thought content normal.        Judgment: Judgment normal.      UC Treatments / Results  Labs (all labs ordered are listed, but only abnormal results are displayed) Labs Reviewed - No data to display  EKG   Radiology No results found.  Procedures Procedures (including critical care time)  Medications Ordered in UC Medications - No data to display  Initial Impression / Assessment and Plan / UC Course  I have reviewed the triage vital signs and the nursing notes.  Pertinent labs & imaging results that were available during my care of the patient were reviewed by me and considered in my medical decision making (see chart for details).     Physical exam is consistent with cold sore.  Will treat with Valtrex.  Advised strict follow-up precautions if symptoms persist or worsen.  Patient  verbalized understanding and was agreeable with plan.  Sign language interpreter used throughout patient interaction. Final Clinical Impressions(s) / UC Diagnoses   Final diagnoses:  Cold sore     Discharge Instructions      You have a cold sore so I have prescribed Valtrex to take for this.  Follow-up if any symptoms persist or worsen.    ED Prescriptions     Medication Sig Dispense Auth. Provider   valACYclovir (VALTREX) 1000 MG tablet Take 1 tablet (1,000 mg total) by mouth 2 (two) times daily for 7 days. 14 tablet [place], Albin Duckett E, [place]      PDMP not reviewed this encounter.   Dodson Freestone, [place] 02/03/24 [phone]

## 2024-02-03 NOTE — Discharge Instructions (Signed)
 You have a cold sore so I have prescribed Valtrex to take for this.  Follow-up if any symptoms persist or worsen.

## 2024-05-28 ENCOUNTER — Emergency Department (HOSPITAL_COMMUNITY)
Admission: EM | Admit: 2024-05-28 | Discharge: 2024-05-28 | Disposition: A | Discharge status: Home / Self Care | Attending: Emergency Medicine | Admitting: Emergency Medicine

## 2024-05-28 ENCOUNTER — Other Ambulatory Visit: Payer: Self-pay

## 2024-05-28 DIAGNOSIS — Z7984 Long term (current) use of oral hypoglycemic drugs: Secondary | ICD-10-CM | POA: Insufficient documentation

## 2024-05-28 DIAGNOSIS — R7309 Other abnormal glucose: Secondary | ICD-10-CM | POA: Insufficient documentation

## 2024-05-28 DIAGNOSIS — R112 Nausea with vomiting, unspecified: Secondary | ICD-10-CM | POA: Diagnosis not present

## 2024-05-28 DIAGNOSIS — R1110 Vomiting, unspecified: Secondary | ICD-10-CM | POA: Diagnosis present

## 2024-05-28 DIAGNOSIS — E860 Dehydration: Secondary | ICD-10-CM | POA: Insufficient documentation

## 2024-05-28 DIAGNOSIS — Z79899 Other long term (current) drug therapy: Secondary | ICD-10-CM | POA: Insufficient documentation

## 2024-05-28 DIAGNOSIS — E875 Hyperkalemia: Secondary | ICD-10-CM | POA: Insufficient documentation

## 2024-05-28 LAB — COMPREHENSIVE METABOLIC PANEL WITH GFR
ALT: 38 U/L (ref 0–44)
AST: 49 U/L — ABNORMAL HIGH (ref 15–41)
Albumin: 4.4 g/dL (ref 3.5–5.0)
Alkaline Phosphatase: 55 U/L (ref 38–126)
Anion gap: 14 (ref 5–15)
BUN: 16 mg/dL (ref 6–20)
CO2: 18 mmol/L — ABNORMAL LOW (ref 22–32)
Calcium: 9.1 mg/dL (ref 8.9–10.3)
Chloride: 107 mmol/L (ref 98–111)
Creatinine, Ser: 1.13 mg/dL — ABNORMAL HIGH (ref 0.44–1.00)
GFR, Estimated: >60 mL/min (ref >60)
Glucose, Bld: 66 mg/dL — ABNORMAL LOW (ref 70–99)
Potassium: 6 mmol/L — ABNORMAL HIGH (ref 3.5–5.1)
Sodium: 139 mmol/L (ref 135–145)
Total Bilirubin: 1.1 mg/dL (ref 0.0–1.2)
Total Protein: 8 g/dL (ref 6.5–8.1)

## 2024-05-28 LAB — CBG MONITORING, ED: Glucose-Capillary: 63 mg/dL — ABNORMAL LOW (ref 70–99)

## 2024-05-28 LAB — I-STAT CHEM 8, ED
BUN: 14 mg/dL (ref 6–20)
Calcium, Ion: 1.07 mmol/L — ABNORMAL LOW (ref 1.15–1.40)
Chloride: 106 mmol/L (ref 98–111)
Creatinine, Ser: 1 mg/dL (ref 0.44–1.00)
Glucose, Bld: 98 mg/dL (ref 70–99)
HCT: 38 % (ref 36.0–46.0)
Hemoglobin: 12.9 g/dL (ref 12.0–15.0)
Potassium: 4.4 mmol/L (ref 3.5–5.1)
Sodium: 137 mmol/L (ref 135–145)
TCO2: 20 mmol/L — ABNORMAL LOW (ref 22–32)

## 2024-05-28 LAB — CBC
HCT: 44.1 % (ref 36.0–46.0)
Hemoglobin: 13.5 g/dL (ref 12.0–15.0)
MCH: 29.1 pg (ref 26.0–34.0)
MCHC: 30.6 g/dL (ref 30.0–36.0)
MCV: 95 fL (ref 80.0–100.0)
Platelets: 314 K/uL (ref 150–400)
RBC: 4.64 MIL/uL (ref 3.87–5.11)
RDW: 14 % (ref 11.5–15.5)
WBC: 9.6 K/uL (ref 4.0–10.5)
nRBC: 0 % (ref 0.0–0.2)

## 2024-05-28 LAB — HCG, SERUM, QUALITATIVE: Preg, Serum: NEGATIVE

## 2024-05-28 LAB — LIPASE, BLOOD: Lipase: 38 U/L (ref 11–51)

## 2024-05-28 MED ORDER — ONDANSETRON HCL 4 MG/2ML IJ SOLN
4.0000 mg | Freq: Once | INTRAMUSCULAR | Status: AC | PRN
  Administered 2024-05-28: 4 mg via INTRAVENOUS
  Filled 2024-05-28: qty 2

## 2024-05-28 MED ORDER — LACTATED RINGERS IV BOLUS
2000.0000 mL | Freq: Once | INTRAVENOUS | Status: AC
  Administered 2024-05-28: 2000 mL via INTRAVENOUS

## 2024-05-28 MED ORDER — DEXTROSE 10 % IV SOLN: INTRAVENOUS | Status: DC

## 2024-05-28 MED ORDER — ONDANSETRON 8 MG PO TBDP: 8.0000 mg | ORAL_TABLET | Freq: Three times a day (TID) | ORAL | 0 refills | Status: AC | PRN

## 2024-05-28 MED ORDER — ONDANSETRON HCL 4 MG/2ML IJ SOLN
4.0000 mg | Freq: Once | INTRAMUSCULAR | Status: AC
  Administered 2024-05-28: 4 mg via INTRAVENOUS
  Filled 2024-05-28: qty 2

## 2024-05-28 NOTE — ED Provider Triage Note (Signed)
 Emergency Medicine Provider Triage Evaluation Note  ALVILDA MCKENNA , a 33 y.o. adult  was evaluated in triage.  Pt complains of n/v starting last night x 20 accompanied with chills and epigastric pain. Feels better with hot shower.  Endorses marijuana use yesterday shortly before symptoms began.   Denies alcohol, medication use.  Denies fever, diarrhea, dysuria, vaginal discharge/bleeding.   Review of Systems  Positive: N/a Negative: N/a  Physical Exam  BP 137/85   Pulse (!) 57   Temp 97.7 F (36.5 C) (Axillary)   Resp 18   SpO2 100%  Gen:   Awake, no distress   Resp:  Normal effort  MSK:   Moves extremities without difficulty  Other:    Medical Decision Making  Medically screening exam initiated at 3:11 PM.  Appropriate orders placed.  Deniya Craigo Allen-Draft was informed that the remainder of the evaluation will be completed by another provider, this initial triage assessment does not replace that evaluation, and the importance of remaining in the ED until their evaluation is complete.     Beola Terrall RAMAN, [place] 05/28/24 [phone]

## 2024-05-28 NOTE — ED Notes (Signed)
Pt was given orange juice to drink

## 2024-05-28 NOTE — ED Notes (Signed)
 Lab said they need a red top for this patient.

## 2024-05-28 NOTE — Discharge Instructions (Signed)
 Take the antinausea medication as needed.  Slowly advance your diet.  Avoid fried foods, or greasy foods for the next 24 hours to avoid triggering your symptoms again.

## 2024-05-28 NOTE — ED Notes (Signed)
 Contacted lab regarding BMP; lab did not receive blood work

## 2024-05-28 NOTE — ED Triage Notes (Signed)
 BIBA for N/V since this AM. Pt reports vomiting 20 times already. Pt is deaf . 165/82 bp 60 hr 84 cbg 100% r/a

## 2024-05-28 NOTE — ED Provider Notes (Signed)
 [place] EMERGENCY DEPARTMENT AT [facility] Provider Note   CSN: [id number] Arrival date & time: 05/28/24  1357   Sign language translator used during visit  Patient presents with: Emesis   Patricia Sanchez is a 33 y.o. adult.    Emesis    Pt states she started haivng multiple episodes of vomiting this am.  Over 20 times since this morning.  No diarrhea.  No fevers.  No abd pain.  SHe feels dehydrated and feels like she needs fluids.  No prior sx like this.  No known ill contacts. No abx use, no travel, no camping.  Prior to Admission medications   Medication Sig Start Date End Date Taking? Authorizing Provider  ondansetron  (ZOFRAN -ODT) 8 MG disintegrating tablet Take 1 tablet (8 mg total) by mouth every 8 (eight) hours as needed for nausea or vomiting. 05/28/24  Yes Randol Simmonds, MD  acetaZOLAMIDE  (DIAMOX ) 250 MG tablet Take 1 tablet (250 mg total) by mouth 2 (two) times daily. 02/06/23   Rush Nest, MD  amLODipine (NORVASC) 5 MG tablet Take 5 mg by mouth daily. 10/31/23   [provider]  metFORMIN (GLUCOPHAGE) 500 MG tablet Take 500 mg by mouth daily with breakfast. 03/25/23   [provider]  naproxen  (NAPROSYN ) 500 MG tablet Take 500 mg by mouth 2 (two) times daily as needed.    [provider]  phentermine 15 MG capsule Take 15 mg by mouth daily. 11/21/23   [provider]    Allergies: Patient has no known allergies.    Review of Systems  Gastrointestinal:  Positive for vomiting.    Updated Vital Signs BP (!) 140/80   Pulse 68   Temp 98 F (36.7 C) (Oral)   Resp 18   SpO2 100%   Physical Exam Vitals and nursing note reviewed.  Constitutional:      General: Patricia Sanchez is not in acute distress.    Appearance: Patricia Sanchez is well-developed.  HENT:     Head: Normocephalic and atraumatic.     Right Ear: External ear normal.     Left Ear: External ear normal.  Eyes:     General: No scleral icterus.       Right  eye: No discharge.        Left eye: No discharge.     Conjunctiva/sclera: Conjunctivae normal.  Neck:     Trachea: No tracheal deviation.  Cardiovascular:     Rate and Rhythm: Normal rate and regular rhythm.  Pulmonary:     Effort: Pulmonary effort is normal. No respiratory distress.     Breath sounds: Normal breath sounds. No stridor. No wheezing or rales.  Abdominal:     General: Bowel sounds are normal. There is no distension.     Palpations: Abdomen is soft.     Tenderness: There is no abdominal tenderness. There is no guarding or rebound.  Musculoskeletal:        General: No tenderness or deformity.     Cervical back: Neck supple.  Skin:    General: Skin is warm and dry.     Findings: No rash.  Neurological:     General: No focal deficit present.     Mental Status: Patricia Sanchez is alert.     Cranial Nerves: No cranial nerve deficit, dysarthria or facial asymmetry.     Sensory: No sensory deficit.     Motor: No abnormal muscle tone or seizure activity.     Coordination: Coordination normal.  Psychiatric:        Mood and Affect: Mood normal.     (all labs ordered are listed, but only abnormal results are displayed) Labs Reviewed  COMPREHENSIVE METABOLIC PANEL WITH GFR - Abnormal; Notable for the following components:      Result Value   Potassium 6.0 (*)    CO2 18 (*)    Glucose, Bld 66 (*)    Creatinine, Ser 1.13 (*)    AST 49 (*)    All other components within normal limits  CBG MONITORING, ED - Abnormal; Notable for the following components:   Glucose-Capillary 63 (*)    All other components within normal limits  I-STAT CHEM 8, ED - Abnormal; Notable for the following components:   Calcium, Ion 1.07 (*)    TCO2 20 (*)    All other components within normal limits  CBC  LIPASE, BLOOD  HCG, SERUM, QUALITATIVE  URINALYSIS, ROUTINE W REFLEX MICROSCOPIC  BASIC METABOLIC PANEL WITH GFR    EKG: None  Radiology: No results found.   Procedures   Medications  Ordered in the ED  dextrose  10 % infusion (0 mLs Intravenous Paused 05/28/24 1928)  ondansetron  (ZOFRAN ) injection 4 mg (4 mg Intravenous Given 05/28/24 1510)  lactated ringers  bolus 2,000 mL (0 mLs Intravenous Paused 05/28/24 1928)  ondansetron  (ZOFRAN ) injection 4 mg (4 mg Intravenous Given 05/28/24 1756)    Clinical Course as of 05/28/24 2119  Fri May 28, 2024  1731 Comprehensive metabolic panel with GFR(!) Metabolic panel shows increased potassium decreased bicarb, decreased glucose.  Normal creatinine [JK]  1732 CBC CBC normal. [JK]  2110 I-stat chem 8, ED (not at [facility], [facility] or [facility])(!) I-STAT Chem-8 shows improved bicarb and normal potassium [JK]  2110 Patient is feeling much better after IV fluids.  She has been able to tolerate eating a sandwich [JK]    Clinical Course User Index [JK] Randol Simmonds, MD                                 Medical Decision Making Problems Addressed: Dehydration: acute illness or injury that poses a threat to life or bodily functions Nausea and vomiting, unspecified vomiting type: acute illness or injury that poses a threat to life or bodily functions  Amount and/or Complexity of Data Reviewed Labs: ordered. Decision-making details documented in ED Course.  Risk Prescription drug management.   Patient presented to ED with complaints of multiple episodes of nausea and vomiting.  Abdominal exam is benign.    Patient's labs initially were notable for elevated potassium although there was hemolysis.  Patient also showed increased creatinine and decreased glucose.  She also had decreased bicarb.  Patient was treated with IV fluids and antiemetics.  Her symptoms improved significantly.  She was able to eat a sandwich did not have any further episodes of vomiting.  Low suspicion for obstruction or acute surgical emergency.  Repeat metabolic panel was performed.  It did show improvement of her creatinine and improved glucose.  Patient is feeling much better now.   She is hungry and would like to go home.  Suspect symptoms could have been related to viral illness, food poisoning.     Final diagnoses:  Nausea and vomiting, unspecified vomiting type  Dehydration    ED Discharge Orders          Ordered    ondansetron  (ZOFRAN -ODT) 8 MG disintegrating tablet  Every 8 hours  PRN        05/28/24 2111               Randol Simmonds, MD 05/28/24 2121

## 2024-07-15 ENCOUNTER — Other Ambulatory Visit: Payer: Self-pay

## 2024-07-15 ENCOUNTER — Ambulatory Visit
Admission: RE | Admit: 2024-07-15 | Discharge: 2024-07-15 | Disposition: A | Discharge status: Home / Self Care | Attending: Physician Assistant | Admitting: Physician Assistant

## 2024-07-15 VITALS — BP 127/85 | HR 66 | Temp 98.4°F | Resp 18 | Ht 71.0 in | Wt 277.8 lb

## 2024-07-15 DIAGNOSIS — L731 Pseudofolliculitis barbae: Secondary | ICD-10-CM | POA: Diagnosis not present

## 2024-07-15 MED ORDER — ADAPALENE 0.1 % EX GEL: Freq: Every day | CUTANEOUS | 0 refills | Status: AC

## 2024-07-15 MED ORDER — BENZOYL PEROXIDE WASH 5 % EX LIQD: Freq: Two times a day (BID) | CUTANEOUS | 12 refills | Status: AC

## 2024-07-15 NOTE — ED Provider Notes (Signed)
 [facility]    CSN: [id number] Arrival date & time: 07/15/24  1524      History   Chief Complaint Chief Complaint  Patient presents with   Ingrown Hair     HPI Patricia Sanchez is a 33 y.o. adult.   HPI  American Sign Language interpretor to assist with encounter  Roman [phone]  Pt presents today with concerns for papular lesion along the left side of her face that has been present for about 3 months. She reports that it is painful and has not improved with home interventions  She has tried popping it and scratching it, she has tried using tweezers but none of these have provided relief. She reports that is painful and thinks it is getting larger in size  She denies drainage or bleeding from the area    Past Medical History:  Diagnosis Date   Anxiety    Arthritis    Benign intracranial hypertension    Chronic daily headache    Deaf    Hypertension    Injury of flexor tendon of hand 12/19/2013   right ring and small fingers   Insomnia    Migraine, unspecified, not intractable, without status migrainosus    Postoperative nausea and vomiting     Patient Active Problem List   Diagnosis Date Noted   Well woman exam with routine gynecological exam 12/03/2023   Deaf 11/01/2011    Past Surgical History:  Procedure Laterality Date   BUNIONECTOMY Left    CHOLECYSTECTOMY  11/01/2011   Procedure: LAPAROSCOPIC CHOLECYSTECTOMY;  Surgeon: Camellia CHRISTELLA Blush, MD;  Location: [facility];  Service: General;  Laterality: N/A;  with intraoperative cholangiogram   HAND SURGERY     KNEE SURGERY Right    TENDON REPAIR Right 12/29/2013   Procedure: FLEXOR TENDON REPAIR  RIGHT RING FINGER AND RIGHT SMALL FINGER;  Surgeon: Balinda Rogue, MD;  Location: [facility];  Service: Plastics;  Laterality: Right;   TONSILLECTOMY AND ADENOIDECTOMY  2001   WISDOM TOOTH EXTRACTION  03/26/2011    OB History   No obstetric history on file.      Home Medications     Prior to Admission medications   Medication Sig Start Date End Date Taking? Authorizing Provider  adapalene  (DIFFERIN ) 0.1 % gel Apply topically at bedtime. 07/15/24  Yes Kenika Sahm E, PA-C  benzoyl peroxide  5 % external liquid Apply topically 2 (two) times daily. 07/15/24  Yes Tay Whitwell E, PA-C  acetaZOLAMIDE  (DIAMOX ) 250 MG tablet Take 1 tablet (250 mg total) by mouth 2 (two) times daily. 02/06/23   Rush Nest, MD  amLODipine (NORVASC) 5 MG tablet Take 5 mg by mouth daily. 10/31/23   [provider]  metFORMIN (GLUCOPHAGE) 500 MG tablet Take 500 mg by mouth daily with breakfast. 03/25/23   [provider]  naproxen  (NAPROSYN ) 500 MG tablet Take 500 mg by mouth 2 (two) times daily as needed.    [provider]  ondansetron  (ZOFRAN -ODT) 8 MG disintegrating tablet Take 1 tablet (8 mg total) by mouth every 8 (eight) hours as needed for nausea or vomiting. 05/28/24   Randol Simmonds, MD  phentermine 15 MG capsule Take 15 mg by mouth daily. 11/21/23   [provider]    Family History Family History  Problem Relation Age of Onset   Hypertension Mother    Hypertension Maternal Grandmother    Diabetes Maternal Grandmother    Migraines Neg Hx    Headache Neg Hx  Colon cancer Neg Hx    Colon polyps Neg Hx    Esophageal cancer Neg Hx    Rectal cancer Neg Hx    Stomach cancer Neg Hx     Social History Social History   Tobacco Use   Smoking status: Never   Smokeless tobacco: Never  Vaping Use   Vaping status: Never Used  Substance Use Topics   Alcohol use: Yes    Alcohol/week: 0.0 standard drinks of alcohol    Comment: occ   Drug use: Yes    Types: Marijuana     Allergies   Patient has no known allergies.   Review of Systems Review of Systems  Skin:        Bump on the side of face. Concern for ingrown hair      Physical Exam Triage Vital Signs ED Triage Vitals  Encounter Vitals Group     BP 07/15/24 1550 127/85     Girls Systolic  BP Percentile --      Girls Diastolic BP Percentile --      Boys Systolic BP Percentile --      Boys Diastolic BP Percentile --      Pulse Rate 07/15/24 1550 66     Resp 07/15/24 1550 18     Temp 07/15/24 1550 98.4 F (36.9 C)     Temp Source 07/15/24 1550 Oral     SpO2 07/15/24 1550 97 %     Weight 07/15/24 1552 277 lb 12.5 oz (126 kg)     Height 07/15/24 1552 5' 11 (1.803 m)     Head Circumference --      Peak Flow --      Pain Score 07/15/24 1551 4     Pain Loc --      Pain Education --      Exclude from Growth Chart --    No data found.  Updated Vital Signs BP 127/85 (BP Location: Right Arm)   Pulse 66   Temp 98.4 F (36.9 C) (Oral)   Resp 18   Ht 5' 11 (1.803 m)   Wt 277 lb 12.5 oz (126 kg)   SpO2 97%   BMI 38.74 kg/m   Visual Acuity Right Eye Distance:   Left Eye Distance:   Bilateral Distance:    Right Eye Near:   Left Eye Near:    Bilateral Near:     Physical Exam Vitals reviewed.  Constitutional:      General: Patricia Sanchez is awake.     Appearance: Normal appearance. Patricia Sanchez is well-developed and well-groomed.  HENT:     Head: Normocephalic and atraumatic.   Eyes:     Extraocular Movements: Extraocular movements intact.     Conjunctiva/sclera: Conjunctivae normal.  Pulmonary:     Effort: Pulmonary effort is normal.  Musculoskeletal:     Cervical back: Normal range of motion.  Neurological:     Mental Status: Patricia Sanchez is alert and oriented to person, place, and time.  Psychiatric:        Attention and Perception: Attention normal.        Mood and Affect: Mood normal.        Speech: Speech normal.        Behavior: Behavior normal. Behavior is cooperative.        Thought Content: Thought content normal.        Judgment: Judgment normal.      UC Treatments / Results  Labs (all labs ordered are  listed, but only abnormal results are displayed) Labs Reviewed - No data to display  EKG   Radiology No results  found.  Procedures Procedures (including critical care time)  Medications Ordered in UC Medications - No data to display  Initial Impression / Assessment and Plan / UC Course  I have reviewed the triage vital signs and the nursing notes.  Pertinent labs & imaging results that were available during my care of the patient were reviewed by me and considered in my medical decision making (see chart for details).      Final Clinical Impressions(s) / UC Diagnoses   Final diagnoses:  Ingrown hair  Patient presents today with concerns for ingrown hair on the left side of the face.  They report that this has been ongoing for about 3 months and is painful and does not seem to be improving with home measures.  They have tried popping the area, using tweezers to pull hairs out, scratching at it but this has not provided any relief and it seems to be getting bigger.  I discussed attempting a potential incision and drainage of the area to remove the hair and purulent material but patient was concerned for potential complications that may arise such as scarring, pain, incomplete drainage and declined the procedure today.  I discussed other potential treatment such as topical face washes and retinoid cream to assist with symptoms.  Patient was amenable to trying this so benzyl peroxide facial wash and Differin  gel were sent in to pharmacy.  Reviewed with patient that these may cause increase in sun to be to sunlight so they should use appropriate protective clothing and sunscreen to help prevent sunburn.  Reviewed that if this does not provide relief they may need referral to dermatology for more definitive resolution.  Follow-up as needed.   Discharge Instructions      You are seen today for concerns of an ingrown hair on the left side of your face.  We discussed potential procedure called incision and drainage but you declined this as you were concerned for the potential complications such as scarring or  pain that may be associated with this.  Instead we decided to use topical medications to help with resolution.  I have sent in a benzyl peroxide facial wash for you to use twice per day as well as a topical retinoid called adapalene /Differin  for you to apply at bedtime.  These medications should help unclog the ingrown hair and help with cell turnover to prevent further irritation or increased size of the area.  If you feel like this is not helping with the ingrown hair I recommend following up with a dermatologist as you may need to have the bump surgically removed     ED Prescriptions     Medication Sig Dispense Auth. Provider   benzoyl peroxide  5 % external liquid Apply topically 2 (two) times daily. 142 g Manpreet Strey E, PA-C   adapalene  (DIFFERIN ) 0.1 % gel Apply topically at bedtime. 45 g Aneesha Holloran E, PA-C      PDMP not reviewed this encounter.   Micajah Dennin E, PA-C 07/15/24 2127

## 2024-07-15 NOTE — Discharge Instructions (Addendum)
 You are seen today for concerns of an ingrown hair on the left side of your face.  We discussed potential procedure called incision and drainage but you declined this as you were concerned for the potential complications such as scarring or pain that may be associated with this.  Instead we decided to use topical medications to help with resolution.  I have sent in a benzyl peroxide facial wash for you to use twice per day as well as a topical retinoid called adapalene /Differin  for you to apply at bedtime.  These medications should help unclog the ingrown hair and help with cell turnover to prevent further irritation or increased size of the area.  If you feel like this is not helping with the ingrown hair I recommend following up with a dermatologist as you may need to have the bump surgically removed

## 2024-07-15 NOTE — ED Triage Notes (Signed)
 American Sign Language interpreter used. [place] # [id number].  Pt presents with a chief complaint of ingrown hair on left side of face x 3 months. Pt reports it has grown in size and it is causing her pain. Currently rates overall pain a 4/10. No medications taken/applied PTA. Did attempt to squeeze the area but it is too deep.

## 2024-09-30 ENCOUNTER — Telehealth: Admitting: Physician Assistant

## 2024-09-30 DIAGNOSIS — R3989 Other symptoms and signs involving the genitourinary system: Secondary | ICD-10-CM | POA: Diagnosis not present

## 2024-09-30 MED ORDER — CEPHALEXIN 500 MG PO CAPS: 500.0000 mg | ORAL_CAPSULE | Freq: Two times a day (BID) | ORAL | 0 refills | Status: AC

## 2024-09-30 NOTE — Progress Notes (Signed)
 Virtual Visit Consent   Patricia Sanchez, you are scheduled for a virtual visit with a [facility] provider today. Just as with appointments in the office, your consent must be obtained to participate. Your consent will be active for this visit and any virtual visit you may have with one of our providers in the next 365 days. If you have a MyChart account, a copy of this consent can be sent to you electronically.  As this is a virtual visit, video technology does not allow for your provider to perform a traditional examination. This may limit your provider's ability to fully assess your condition. If your provider identifies any concerns that need to be evaluated in person or the need to arrange testing (such as labs, EKG, etc.), we will make arrangements to do so. Although advances in technology are sophisticated, we cannot ensure that it will always work on either your end or our end. If the connection with a video visit is poor, the visit may have to be switched to a telephone visit. With either a video or telephone visit, we are not always able to ensure that we have a secure connection.  By engaging in this virtual visit, you consent to the provision of healthcare and authorize for your insurance to be billed (if applicable) for the services provided during this visit. Depending on your insurance coverage, you may receive a charge related to this service.  I need to obtain your verbal consent now. Are you willing to proceed with your visit today? Patricia Sanchez has provided verbal consent on 09/30/2024 for a virtual visit (video or telephone). Patricia Sanchez, [place]  Date: 09/30/2024 7:08 PM   Virtual Visit via Video Note   I, Patricia Sanchez, connected with  Patricia Sanchez  ([id number], [birth year]) on 09/30/2024 at  7:00 PM EST by a video-enabled telemedicine application and verified that I am speaking with the correct person using two  identifiers.  Location: Patient: Virtual Visit Location Patient: Home Provider: Virtual Visit Location Provider: Home Office   I discussed the limitations of evaluation and management by telemedicine and the availability of in person appointments. The patient expressed understanding and agreed to proceed.    History of Present Illness: Patricia Sanchez is a 33 y.o. who identifies as a nonbinary who was assigned female at birth, and is being seen today for dysuria, urgency and frequency over past couple of days. Denies fever, chills, vomiting, back or belly pain. Has history of UTI and this feels similar.   HPI: HPI  Problems:  Patient Active Problem List   Diagnosis Date Noted   Well woman exam with routine gynecological exam 12/03/2023   Deaf 11/01/2011    Allergies: Allergies[1] Medications: Current Medications[2]  Observations/Objective: Patient is well-developed, well-nourished in no acute distress.  Resting comfortably  at home.  Head is normocephalic, atraumatic.  No labored breathing.  Speech is clear and coherent with logical content.  Patient is alert and oriented at baseline.   Assessment and Plan: 1. Suspected UTI (Primary) - cephALEXin  (KEFLEX ) 500 MG capsule; Take 1 capsule (500 mg total) by mouth 2 (two) times daily for 7 days.  Dispense: 14 capsule; Refill: 0  Classic UTI symptoms with absence of alarm signs or symptoms. Prior history of UTI. Will treat empirically with Keflex  for suspected uncomplicated cystitis. Supportive measures and OTC medications reviewed. Strict in-person evaluation precautions discussed.    Follow Up Instructions: I discussed the assessment and treatment plan with the  patient. The patient was provided an opportunity to ask questions and all were answered. The patient agreed with the plan and demonstrated an understanding of the instructions.  A copy of instructions were sent to the patient via MyChart unless otherwise noted below.    The patient was advised to call back or seek an in-person evaluation if the symptoms worsen or if the condition fails to improve as anticipated.    Patricia Velma Lunger, PA-C    [1] No Known Allergies [2]  Current Outpatient Medications:    cephALEXin  (KEFLEX ) 500 MG capsule, Take 1 capsule (500 mg total) by mouth 2 (two) times daily for 7 days., Disp: 14 capsule, Rfl: 0   acetaZOLAMIDE  (DIAMOX ) 250 MG tablet, Take 1 tablet (250 mg total) by mouth 2 (two) times daily., Disp: 60 tablet, Rfl: 8   adapalene  (DIFFERIN ) 0.1 % gel, Apply topically at bedtime., Disp: 45 g, Rfl: 0   amLODipine (NORVASC) 5 MG tablet, Take 5 mg by mouth daily., Disp: , Rfl:    benzoyl peroxide  5 % external liquid, Apply topically 2 (two) times daily., Disp: 142 g, Rfl: 12   metFORMIN (GLUCOPHAGE) 500 MG tablet, Take 500 mg by mouth daily with breakfast., Disp: , Rfl:    naproxen  (NAPROSYN ) 500 MG tablet, Take 500 mg by mouth 2 (two) times daily as needed., Disp: , Rfl:    ondansetron  (ZOFRAN -ODT) 8 MG disintegrating tablet, Take 1 tablet (8 mg total) by mouth every 8 (eight) hours as needed for nausea or vomiting., Disp: 12 tablet, Rfl: 0   phentermine 15 MG capsule, Take 15 mg by mouth daily., Disp: , Rfl:

## 2024-09-30 NOTE — Patient Instructions (Signed)
 Patricia Sanchez, thank you for joining Patricia Velma Lunger, PA-C for today's virtual visit.  While this provider is not your primary care provider (PCP), if your PCP is located in our provider database this encounter information will be shared with them immediately following your visit.   A [facility] MyChart account gives you access to today's visit and all your visits, tests, and labs performed at [facility]  click here if you don't have a [facility] MyChart account or go to [email or web address]  Consent: (Patient) Patricia Sanchez provided verbal consent for this virtual visit at the beginning of the encounter.  Current Medications:  Current Outpatient Medications:    acetaZOLAMIDE  (DIAMOX ) 250 MG tablet, Take 1 tablet (250 mg total) by mouth 2 (two) times daily., Disp: 60 tablet, Rfl: 8   adapalene  (DIFFERIN ) 0.1 % gel, Apply topically at bedtime., Disp: 45 g, Rfl: 0   amLODipine (NORVASC) 5 MG tablet, Take 5 mg by mouth daily., Disp: , Rfl:    benzoyl peroxide  5 % external liquid, Apply topically 2 (two) times daily., Disp: 142 g, Rfl: 12   metFORMIN (GLUCOPHAGE) 500 MG tablet, Take 500 mg by mouth daily with breakfast., Disp: , Rfl:    naproxen  (NAPROSYN ) 500 MG tablet, Take 500 mg by mouth 2 (two) times daily as needed., Disp: , Rfl:    ondansetron  (ZOFRAN -ODT) 8 MG disintegrating tablet, Take 1 tablet (8 mg total) by mouth every 8 (eight) hours as needed for nausea or vomiting., Disp: 12 tablet, Rfl: 0   phentermine 15 MG capsule, Take 15 mg by mouth daily., Disp: , Rfl:    Medications ordered in this encounter:  No orders of the defined types were placed in this encounter.    *If you need refills on other medications prior to your next appointment, please contact your pharmacy*  Follow-Up: Call back or seek an in-person evaluation if the symptoms worsen or if the condition fails to improve as anticipated.  [facility] [phone]  Other Instructions Your symptoms are consistent with a bladder infection, also called acute cystitis. Please take your antibiotic (Keflex ) as directed until all pills are gone.  Stay very well hydrated.  Consider a daily probiotic (Align, Culturelle, or Activia) to help prevent stomach upset caused by the antibiotic.  Taking a probiotic daily may also help prevent recurrent UTIs.  Also consider taking AZO (Phenazopyridine) tablets to help decrease pain with urination.  If you note any non-resolving, new, or worsening symptoms despite treatment, please seek an in-person evaluation ASAP.  Urinary Tract Infection A urinary tract infection (UTI) can occur any place along the urinary tract. The tract includes the kidneys, ureters, bladder, and urethra. A type of germ called bacteria often causes a UTI. UTIs are often helped with antibiotic medicine.  HOME CARE  If given, take antibiotics as told by your doctor. Finish them even if you start to feel better. Drink enough fluids to keep your pee (urine) clear or pale yellow. Avoid tea, drinks with caffeine, and bubbly (carbonated) drinks. Pee often. Avoid holding your pee in for a long time. Pee before and after having sex (intercourse). Wipe from front to back after you poop (bowel movement) if you are a woman. Use each tissue only once. GET HELP RIGHT AWAY IF:  You have back pain. You have lower belly (abdominal) pain. You have chills. You feel sick to your stomach (nauseous). You throw up (vomit). Your burning or discomfort with peeing does not  go away. You have a fever. Your symptoms are not better in 3 days. MAKE SURE YOU:  Understand these instructions. Will watch your condition. Will get help right away if you are not doing well or get worse. Document Released: 03/25/2008 Document Revised: 07/01/2012 Document Reviewed: 05/07/2012 [facility] Patient Information 2015 [place], [place]. This information is not intended to replace advice  given to you by your health care provider. Make sure you discuss any questions you have with your health care provider.    If you have been instructed to have an in-person evaluation today at a local Urgent Care facility, please use the link below. It will take you to a list of all of our available [facility], including address, phone number and hours of operation. Please do not delay care.  [facility]  If you or a family member do not have a primary care provider, use the link below to schedule a visit and establish care. When you choose a [facility] primary care physician or advanced practice provider, you gain a long-term partner in health. Find a Primary Care Provider  Learn more about [facility]'s in-office and virtual care options:  - Get Care Now

## 2024-12-03 ENCOUNTER — Ambulatory Visit: Payer: Medicare Other | Admitting: Obstetrics and Gynecology
